# Patient Record
Sex: Female | Born: 1970 | Race: White | Hispanic: No | Marital: Married | Smoking: Former smoker
Health system: Southern US, Community
[De-identification: ages and names within clinical notes are randomized; demographics above are authoritative.]

## PROBLEM LIST (undated history)

## (undated) DIAGNOSIS — R002 Palpitations: Secondary | ICD-10-CM

## (undated) DIAGNOSIS — R197 Diarrhea, unspecified: Secondary | ICD-10-CM

## (undated) DIAGNOSIS — Z87442 Personal history of urinary calculi: Secondary | ICD-10-CM

## (undated) DIAGNOSIS — K219 Gastro-esophageal reflux disease without esophagitis: Secondary | ICD-10-CM

## (undated) DIAGNOSIS — I10 Essential (primary) hypertension: Secondary | ICD-10-CM

## (undated) DIAGNOSIS — D649 Anemia, unspecified: Secondary | ICD-10-CM

## (undated) DIAGNOSIS — K449 Diaphragmatic hernia without obstruction or gangrene: Secondary | ICD-10-CM

## (undated) DIAGNOSIS — F419 Anxiety disorder, unspecified: Secondary | ICD-10-CM

## (undated) HISTORY — PX: LEEP: SHX91

## (undated) HISTORY — PX: TUBAL LIGATION: SHX77

## (undated) HISTORY — PX: CHOLECYSTECTOMY: SHX55

## (undated) HISTORY — PX: APPENDECTOMY: SHX54

## (undated) HISTORY — DX: Diarrhea, unspecified: R19.7

## (undated) SURGERY — ESOPHAGOGASTRODUODENOSCOPY (EGD) WITH PROPOFOL
Anesthesia: Monitor Anesthesia Care

## (undated) SURGERY — COLONOSCOPY
Anesthesia: Moderate Sedation

---

## 2011-12-13 ENCOUNTER — Emergency Department (HOSPITAL_COMMUNITY)
Admission: EM | Admit: 2011-12-13 | Discharge: 2011-12-14 | Disposition: A | Discharge status: Home / Self Care | Payer: PRIVATE HEALTH INSURANCE | Attending: Emergency Medicine | Admitting: Emergency Medicine

## 2011-12-13 ENCOUNTER — Emergency Department (HOSPITAL_COMMUNITY): Payer: PRIVATE HEALTH INSURANCE

## 2011-12-13 ENCOUNTER — Encounter (HOSPITAL_COMMUNITY): Payer: Self-pay | Admitting: Emergency Medicine

## 2011-12-13 DIAGNOSIS — I10 Essential (primary) hypertension: Secondary | ICD-10-CM | POA: Insufficient documentation

## 2011-12-13 DIAGNOSIS — M79609 Pain in unspecified limb: Secondary | ICD-10-CM | POA: Insufficient documentation

## 2011-12-13 DIAGNOSIS — S93609A Unspecified sprain of unspecified foot, initial encounter: Secondary | ICD-10-CM | POA: Insufficient documentation

## 2011-12-13 DIAGNOSIS — S8391XA Sprain of unspecified site of right knee, initial encounter: Secondary | ICD-10-CM

## 2011-12-13 DIAGNOSIS — M25569 Pain in unspecified knee: Secondary | ICD-10-CM | POA: Insufficient documentation

## 2011-12-13 DIAGNOSIS — W010XXA Fall on same level from slipping, tripping and stumbling without subsequent striking against object, initial encounter: Secondary | ICD-10-CM | POA: Insufficient documentation

## 2011-12-13 DIAGNOSIS — S93601A Unspecified sprain of right foot, initial encounter: Secondary | ICD-10-CM

## 2011-12-13 DIAGNOSIS — K219 Gastro-esophageal reflux disease without esophagitis: Secondary | ICD-10-CM | POA: Insufficient documentation

## 2011-12-13 DIAGNOSIS — W19XXXA Unspecified fall, initial encounter: Secondary | ICD-10-CM

## 2011-12-13 DIAGNOSIS — IMO0002 Reserved for concepts with insufficient information to code with codable children: Secondary | ICD-10-CM | POA: Insufficient documentation

## 2011-12-13 HISTORY — DX: Essential (primary) hypertension: I10

## 2011-12-13 HISTORY — DX: Gastro-esophageal reflux disease without esophagitis: K21.9

## 2011-12-13 NOTE — ED Notes (Signed)
Patient was walking out of patient room and slipped on object in the floor.  Patient fell and twisted right foot and right knee.

## 2011-12-14 NOTE — Discharge Instructions (Signed)
Foot Sprain  The muscles and cord like structures which attach muscle to bone (tendons) that surround the feet are made up of units. A foot sprain can occur at the weakest spot in any of these units. This condition is most often caused by injury to or overuse of the foot, as from playing contact sports, or aggravating a previous injury, or from poor conditioning, or obesity.  SYMPTOMS  · Pain with movement of the foot.  · Tenderness and swelling at the injury site.  · Loss of strength is present in moderate or severe sprains.  THE THREE GRADES OR SEVERITY OF FOOT SPRAIN ARE:  · Mild (Grade I): Slightly pulled muscle without tearing of muscle or tendon fibers or loss of strength.  · Moderate (Grade II): Tearing of fibers in a muscle, tendon, or at the attachment to bone, with small decrease in strength.  · Severe (Grade III): Rupture of the muscle-tendon-bone attachment, with separation of fibers. Severe sprain requires surgical repair. Often repeating (chronic) sprains are caused by overuse. Sudden (acute) sprains are caused by direct injury or over-use.  DIAGNOSIS   Diagnosis of this condition is usually by your own observation. If problems continue, a caregiver may be required for further evaluation and treatment. X-rays may be required to make sure there are not breaks in the bones (fractures) present. Continued problems may require physical therapy for treatment.  PREVENTION  · Use strength and conditioning exercises appropriate for your sport.  · Warm up properly prior to working out.  · Use athletic shoes that are made for the sport you are participating in.  · Allow adequate time for healing. Early return to activities makes repeat injury more likely, and can lead to an unstable arthritic foot that can result in prolonged disability. Mild sprains generally heal in 3 to 10 days, with moderate and severe sprains taking 2 to 10 weeks. Your caregiver can help you determine the proper time required for  healing.  HOME CARE INSTRUCTIONS   · Apply ice to the injury for 15 to 20 minutes, 3 to 4 times per day. Put the ice in a plastic bag and place a towel between the bag of ice and your skin.  · An elastic wrap (like an Ace bandage) may be used to keep swelling down.  · Keep foot above the level of the heart, or at least raised on a footstool, when swelling and pain are present.  · Try to avoid use other than gentle range of motion while the foot is painful. Do not resume use until instructed by your caregiver. Then begin use gradually, not increasing use to the point of pain. If pain does develop, decrease use and continue the above measures, gradually increasing activities that do not cause discomfort, until you gradually achieve normal use.  · Use crutches if and as instructed, and for the length of time instructed.  · Keep injured foot and ankle wrapped between treatments.  · Massage foot and ankle for comfort and to keep swelling down. Massage from the toes up towards the knee.  · Only take over-the-counter or prescription medicines for pain, discomfort, or fever as directed by your caregiver.  SEEK IMMEDIATE MEDICAL CARE IF:   · Your pain and swelling increase, or pain is not controlled with medications.  · You have loss of feeling in your foot or your foot turns cold or blue.  · You develop new, unexplained symptoms, or an increase of the symptoms that brought you   to your caregiver.  MAKE SURE YOU:   · Understand these instructions.  · Will watch your condition.  · Will get help right away if you are not doing well or get worse.  Document Released: 03/02/2002 Document Revised: 08/30/2011 Document Reviewed: 04/29/2008  [employer]® Patient Information ©2012 [employer].

## 2011-12-14 NOTE — ED Provider Notes (Signed)
History     CSN: [id number]  Arrival date & time 12/13/11  2335   First MD Initiated Contact with Patient 12/14/11 0038      Chief Complaint  Patient presents with  . Foot Injury  . Fall    (Consider location/radiation/quality/duration/timing/severity/associated sxs/prior treatment) HPI Comments: Slipped on needle cap that was on the floor, injured the right foot and knee.  Patient is a 40 y.o. female presenting with foot injury and fall. The history is provided by the patient.  Foot Injury  The incident occurred less than 1 hour ago. The incident occurred at work. The injury mechanism was a fall. The pain is present in the right knee and right foot. The pain is moderate. The pain has been worsening since onset. Associated symptoms include inability to bear weight.  Fall    Past Medical History  Diagnosis Date  . Hypertension   . GERD (gastroesophageal reflux disease)     Past Surgical History  Procedure Date  . Cholecystectomy   . Tubal ligation     No family history on file.  History  Substance Use Topics  . Smoking status: Former Games developer  . Smokeless tobacco: Not on file  . Alcohol Use: No    OB History    Grav Para Term Preterm Abortions TAB SAB Ect Mult Living                  Review of Systems  All other systems reviewed and are negative.    Allergies  Aspirin and Sulfa antibiotics  Home Medications   Current Outpatient Rx  Name Route Sig Dispense Refill  . LISINOPRIL-HYDROCHLOROTHIAZIDE 20-12.5 MG PO TABS Oral Take 2 tablets by mouth daily.    Marland Kitchen OMEPRAZOLE 20 MG PO CPDR Oral Take 20 mg by mouth daily.      BP 143/79  Pulse 93  Temp(Src) 98.3 F (36.8 C) (Oral)  Resp 18  Ht 5\' 3"  (1.6 m)  Wt 250 lb (113.399 kg)  BMI 44.29 kg/m2  SpO2 97%  LMP 11/15/2011  Physical Exam  Nursing note and vitals reviewed. Constitutional: She is oriented to person, place, and time. She appears well-developed and well-nourished. No distress.  HENT:    Head: Normocephalic and atraumatic.  Neck: Normal range of motion. Neck supple.  Musculoskeletal:       The right foot has a swollen, slightly ecchymotic area to the top of the foot near the distal 4th and 5th metacarpals.    The right knee appears grossly normal.  There is no effusion.  It is stable ap/laterally.  Negative a and p drawer test.  Neurological: She is alert and oriented to person, place, and time.  Skin: Skin is warm and dry.    ED Course  Procedures (including critical care time)  Labs Reviewed - No data to display Dg Knee Complete 4 Views Right  12/14/2011  *RADIOLOGY REPORT*  Clinical Data: Status post fall; right knee pain.  RIGHT KNEE - COMPLETE 4+ VIEW  Comparison: None.  Findings: There is no evidence of fracture or dislocation.  The joint spaces are preserved.  No significant degenerative change is seen; the patellofemoral joint is grossly unremarkable in appearance.  No significant joint effusion is seen.  The visualized soft tissues are normal in appearance.  IMPRESSION: No evidence of fracture or dislocation.  Original Report Authenticated By: Tonia Ghent, M.D.   Dg Foot Complete Right  12/14/2011  *RADIOLOGY REPORT*  Clinical Data: Status post fall; right foot  pain, worsened with toe movement.  RIGHT FOOT COMPLETE - 3+ VIEW  Comparison: None.  Findings: There is no evidence of fracture or dislocation.  The joint spaces are preserved.  There is no evidence of talar subluxation; the subtalar joint is unremarkable in appearance.  A plantar calcaneal spur is seen.  No significant soft tissue abnormalities are seen.  IMPRESSION: No evidence of fracture or dislocation.  Original Report Authenticated By: Tonia Ghent, M.D.     No diagnosis found.    MDM          Geoffery Lyons, MD 12/14/11 [phone]

## 2012-11-03 ENCOUNTER — Emergency Department (HOSPITAL_COMMUNITY)
Admission: EM | Admit: 2012-11-03 | Discharge: 2012-11-04 | Disposition: A | Discharge status: Home / Self Care | Payer: BC Managed Care – PPO | Attending: Emergency Medicine | Admitting: Emergency Medicine

## 2012-11-03 ENCOUNTER — Encounter (HOSPITAL_COMMUNITY): Payer: Self-pay | Admitting: *Deleted

## 2012-11-03 DIAGNOSIS — I10 Essential (primary) hypertension: Secondary | ICD-10-CM | POA: Insufficient documentation

## 2012-11-03 DIAGNOSIS — Z79899 Other long term (current) drug therapy: Secondary | ICD-10-CM | POA: Insufficient documentation

## 2012-11-03 DIAGNOSIS — R197 Diarrhea, unspecified: Secondary | ICD-10-CM | POA: Insufficient documentation

## 2012-11-03 DIAGNOSIS — R109 Unspecified abdominal pain: Secondary | ICD-10-CM | POA: Insufficient documentation

## 2012-11-03 DIAGNOSIS — Z3202 Encounter for pregnancy test, result negative: Secondary | ICD-10-CM | POA: Insufficient documentation

## 2012-11-03 DIAGNOSIS — K529 Noninfective gastroenteritis and colitis, unspecified: Secondary | ICD-10-CM

## 2012-11-03 DIAGNOSIS — Z87891 Personal history of nicotine dependence: Secondary | ICD-10-CM | POA: Insufficient documentation

## 2012-11-03 DIAGNOSIS — K219 Gastro-esophageal reflux disease without esophagitis: Secondary | ICD-10-CM | POA: Insufficient documentation

## 2012-11-03 DIAGNOSIS — K5289 Other specified noninfective gastroenteritis and colitis: Secondary | ICD-10-CM | POA: Insufficient documentation

## 2012-11-03 LAB — CBC WITH DIFFERENTIAL/PLATELET
Eosinophils Absolute: 0.1 10*3/uL (ref 0.0–0.7)
Eosinophils Relative: 1 % (ref 0–5)
HCT: 42.3 % (ref 36.0–46.0)
Lymphs Abs: 2.7 10*3/uL (ref 0.7–4.0)
MCH: 30.2 pg (ref 26.0–34.0)
MCV: 88.7 fL (ref 78.0–100.0)
Monocytes Absolute: 1.1 10*3/uL — ABNORMAL HIGH (ref 0.1–1.0)
Platelets: 271 10*3/uL (ref 150–400)
RBC: 4.77 MIL/uL (ref 3.87–5.11)

## 2012-11-03 LAB — BASIC METABOLIC PANEL
BUN: 9 mg/dL (ref 6–23)
CO2: 24 mEq/L (ref 19–32)
Calcium: 8.8 mg/dL (ref 8.4–10.5)
Creatinine, Ser: 0.65 mg/dL (ref 0.50–1.10)
GFR calc non Af Amer: >90 mL/min (ref >90)
Glucose, Bld: 148 mg/dL — ABNORMAL HIGH (ref 70–99)
Sodium: 136 mEq/L (ref 135–145)

## 2012-11-03 MED ORDER — SODIUM CHLORIDE 0.9 % IV BOLUS (SEPSIS)
1000.0000 mL | Freq: Once | INTRAVENOUS | Status: AC
  Administered 2012-11-03: 1000 mL via INTRAVENOUS

## 2012-11-03 MED ORDER — ONDANSETRON HCL 4 MG/2ML IJ SOLN
INTRAMUSCULAR | Status: AC
  Administered 2012-11-03: 4 mg
  Filled 2012-11-03: qty 2

## 2012-11-03 NOTE — ED Provider Notes (Signed)
Patient with nausea, vomiting, diarrhea illness. Abdomen with no focal area of tenderness. Hyperactive bowel sounds. VSS. Receiving IVF. Ok for discharge home.  I saw and evaluated the patient, reviewed the resident's note and I agree with the findings and plan.    Nicoletta Dress. Colon Branch, MD 11/03/12 2333

## 2012-11-03 NOTE — ED Notes (Signed)
NVD since Saturday, dizzy.

## 2012-11-04 LAB — URINE CULTURE: Colony Count: 70000

## 2012-11-04 LAB — URINALYSIS, ROUTINE W REFLEX MICROSCOPIC
Bilirubin Urine: NEGATIVE
Glucose, UA: NEGATIVE mg/dL
Hgb urine dipstick: NEGATIVE
Ketones, ur: NEGATIVE mg/dL
Urobilinogen, UA: 0.2 mg/dL (ref 0.0–1.0)

## 2012-11-04 LAB — URINE MICROSCOPIC-ADD ON

## 2012-11-04 MED ORDER — POTASSIUM CHLORIDE CRYS ER 20 MEQ PO TBCR: 40.0000 meq | EXTENDED_RELEASE_TABLET | Freq: Once | ORAL | Status: DC

## 2012-11-04 MED ORDER — PROMETHAZINE HCL 12.5 MG PO TABS
ORAL_TABLET | ORAL | Status: AC
  Administered 2012-11-04: 12.5 mg
  Filled 2012-11-04: qty 1

## 2012-11-04 MED ORDER — POTASSIUM CHLORIDE 20 MEQ/15ML (10%) PO LIQD
ORAL | Status: AC
  Administered 2012-11-04: 40 meq
  Filled 2012-11-04: qty 30

## 2012-11-04 MED ORDER — PROMETHAZINE HCL 25 MG PO TABS: 25.0000 mg | ORAL_TABLET | Freq: Four times a day (QID) | ORAL | Status: DC | PRN

## 2012-11-04 NOTE — ED Provider Notes (Signed)
History     CSN: [id number]  Arrival date & time 11/03/12  2213   First MD Initiated Contact with Patient 11/03/12 2307      Chief Complaint  Patient presents with  . Emesis    (Consider location/radiation/quality/duration/timing/severity/associated sxs/prior treatment) Patient is a 42 y.o. female presenting with vomiting. The history is provided by the patient.  Emesis Severity:  Moderate Duration:  3 days Timing:  Intermittent Number of daily episodes:  5 Quality:  Stomach contents Able to tolerate:  Solids and liquids Progression:  Worsening Chronicity:  New Recent urination:  Decreased Relieved by:  Nothing Worsened by:  Nothing tried Associated symptoms: abdominal pain and diarrhea   Associated symptoms: no chills     Past Medical History  Diagnosis Date  . Hypertension   . GERD (gastroesophageal reflux disease)     Past Surgical History  Procedure Laterality Date  . Cholecystectomy    . Tubal ligation      History reviewed. No pertinent family history.  History  Substance Use Topics  . Smoking status: Former Games developer  . Smokeless tobacco: Not on file  . Alcohol Use: No    OB History   Grav Para Term Preterm Abortions TAB SAB Ect Mult Living                  Review of Systems  Constitutional: Negative for fever and chills.  Respiratory: Negative for cough and shortness of breath.   Gastrointestinal: Positive for nausea, vomiting, abdominal pain and diarrhea.  All other systems reviewed and are negative.    Allergies  Aspirin and Sulfa antibiotics  Home Medications   Current Outpatient Rx  Name  Route  Sig  Dispense  Refill  . lisinopril-hydrochlorothiazide (PRINZIDE,ZESTORETIC) 20-12.5 MG per tablet   Oral   Take 2 tablets by mouth daily.         Marland Kitchen omeprazole (PRILOSEC) 20 MG capsule   Oral   Take 20 mg by mouth daily.           BP 132/71  Pulse 94  Temp(Src) 98.2 F (36.8 C) (Oral)  Resp 18  Ht 5\' 2"  (1.575 m)  Wt 265  lb (120.203 kg)  BMI 48.46 kg/m2  SpO2 98%  LMP 10/10/2012  Physical Exam  Nursing note and vitals reviewed. Constitutional: She is oriented to person, place, and time. She appears well-developed and well-nourished. No distress.  HENT:  Head: Normocephalic and atraumatic.  Eyes: EOM are normal. Pupils are equal, round, and reactive to light.  Neck: Normal range of motion. Neck supple.  Cardiovascular: Normal rate and regular rhythm.  Exam reveals no friction rub.   No murmur heard. Pulmonary/Chest: Effort normal and breath sounds normal. No respiratory distress. She has no wheezes. She has no rales.  Abdominal: Soft. She exhibits no distension. There is tenderness (mild, diffuse). There is no rebound.  Musculoskeletal: Normal range of motion. She exhibits no edema.  Neurological: She is alert and oriented to person, place, and time.  Skin: She is not diaphoretic.    ED Course  Procedures (including critical care time)  Labs Reviewed  CBC WITH DIFFERENTIAL - Abnormal; Notable for the following:    WBC 10.8 (*)    Monocytes Absolute 1.1 (*)    All other components within normal limits  BASIC METABOLIC PANEL - Abnormal; Notable for the following:    Potassium 3.0 (*)    Glucose, Bld 148 (*)    All other components within normal limits  URINALYSIS, ROUTINE W REFLEX MICROSCOPIC - Abnormal; Notable for the following:    APPearance CLOUDY (*)    Specific Gravity, Urine >1.030 (*)    Protein, ur TRACE (*)    All other components within normal limits  URINE MICROSCOPIC-ADD ON - Abnormal; Notable for the following:    Squamous Epithelial / LPF FEW (*)    Bacteria, UA MANY (*)    All other components within normal limits  URINE CULTURE  POCT PREGNANCY, URINE   No results found.   1. Gastroenteritis       MDM   42 year old female who presents with vomiting and diarrhea. She's had these symptoms for the past 3 days. She does use getting better and was able to tolerate by  mouth today, however she began vomiting again tonight. She is not running any fevers. She has had no sick contacts. She denies any shortness of breath, chest pain, dysuria, vaginal bleeding, vaginal discharge. Para vitals are stable. She is mild diffuse abdominal tenderness. She states this is the same crampy pain she's had since this started. She is a Engineer, civil (consulting) here. I feel she has gastroenteritis. I gave HER-2 liters normal saline. Her labs show mild dehydration with elevated specific gravity in her urine and mild hypokalemia. Replace her potassium by mouth. After 2 L of saline, she states she is feeling better. She is stable for discharge.  Elwin Mocha, MD 11/04/12 [phone]

## 2012-11-04 NOTE — ED Provider Notes (Signed)
I saw and evaluated the patient, reviewed the resident's note and I agree with the findings and plan.  Caelen Higinbotham S. Oceana Walthall, MD 11/04/12 0112 

## 2013-08-25 ENCOUNTER — Encounter (HOSPITAL_COMMUNITY)
Admission: EM | Disposition: A | Discharge status: Home / Self Care | Payer: Self-pay | Source: Home / Self Care | Attending: Emergency Medicine

## 2013-08-25 ENCOUNTER — Observation Stay (HOSPITAL_COMMUNITY)
Admission: EM | Admit: 2013-08-25 | Discharge: 2013-08-26 | Disposition: A | Discharge status: Home / Self Care | Payer: BC Managed Care – PPO | Attending: General Surgery | Admitting: General Surgery

## 2013-08-25 ENCOUNTER — Encounter (HOSPITAL_COMMUNITY): Payer: BC Managed Care – PPO | Admitting: Anesthesiology

## 2013-08-25 ENCOUNTER — Emergency Department (HOSPITAL_COMMUNITY): Payer: BC Managed Care – PPO | Admitting: Anesthesiology

## 2013-08-25 ENCOUNTER — Emergency Department (HOSPITAL_COMMUNITY): Payer: BC Managed Care – PPO

## 2013-08-25 ENCOUNTER — Encounter (HOSPITAL_COMMUNITY): Payer: Self-pay | Admitting: Emergency Medicine

## 2013-08-25 DIAGNOSIS — K3580 Unspecified acute appendicitis: Secondary | ICD-10-CM | POA: Diagnosis present

## 2013-08-25 DIAGNOSIS — K37 Unspecified appendicitis: Secondary | ICD-10-CM

## 2013-08-25 DIAGNOSIS — K3533 Acute appendicitis with perforation and localized peritonitis, with abscess: Principal | ICD-10-CM | POA: Insufficient documentation

## 2013-08-25 HISTORY — DX: Diaphragmatic hernia without obstruction or gangrene: K44.9

## 2013-08-25 HISTORY — PX: LAPAROSCOPIC APPENDECTOMY: SHX408

## 2013-08-25 LAB — COMPREHENSIVE METABOLIC PANEL
CO2: 23 mEq/L (ref 19–32)
Calcium: 9.7 mg/dL (ref 8.4–10.5)
Creatinine, Ser: 0.6 mg/dL (ref 0.50–1.10)
GFR calc Af Amer: >90 mL/min (ref >90)
GFR calc non Af Amer: >90 mL/min (ref >90)
Glucose, Bld: 119 mg/dL — ABNORMAL HIGH (ref 70–99)
Sodium: 134 mEq/L — ABNORMAL LOW (ref 135–145)
Total Protein: 8.4 g/dL — ABNORMAL HIGH (ref 6.0–8.3)

## 2013-08-25 LAB — CBC WITH DIFFERENTIAL/PLATELET
Basophils Absolute: 0 10*3/uL (ref 0.0–0.1)
Eosinophils Absolute: 0 10*3/uL (ref 0.0–0.7)
Eosinophils Relative: 0 % (ref 0–5)
HCT: 43.7 % (ref 36.0–46.0)
Lymphocytes Relative: 15 % (ref 12–46)
Lymphs Abs: 2.5 10*3/uL (ref 0.7–4.0)
MCH: 29.3 pg (ref 26.0–34.0)
MCV: 87.8 fL (ref 78.0–100.0)
Monocytes Absolute: 1.2 10*3/uL — ABNORMAL HIGH (ref 0.1–1.0)
Platelets: 284 10*3/uL (ref 150–400)
RDW: 14.9 % (ref 11.5–15.5)
WBC: 17.4 10*3/uL — ABNORMAL HIGH (ref 4.0–10.5)

## 2013-08-25 LAB — PREGNANCY, URINE: Preg Test, Ur: NEGATIVE

## 2013-08-25 LAB — URINALYSIS, ROUTINE W REFLEX MICROSCOPIC
Leukocytes, UA: NEGATIVE
Nitrite: NEGATIVE
Protein, ur: NEGATIVE mg/dL
Specific Gravity, Urine: 1.025 (ref 1.005–1.030)
Urobilinogen, UA: 0.2 mg/dL (ref 0.0–1.0)

## 2013-08-25 SURGERY — APPENDECTOMY, LAPAROSCOPIC
Anesthesia: General | Site: Abdomen

## 2013-08-25 MED ORDER — POVIDONE-IODINE 10 % EX OINT
TOPICAL_OINTMENT | CUTANEOUS | Status: AC
  Filled 2013-08-25: qty 1

## 2013-08-25 MED ORDER — LACTATED RINGERS IV SOLN
INTRAVENOUS | Status: DC
  Administered 2013-08-26: 03:00:00 via INTRAVENOUS

## 2013-08-25 MED ORDER — SODIUM CHLORIDE 0.9 % IV SOLN
3.0000 g | Freq: Four times a day (QID) | INTRAVENOUS | Status: DC
  Administered 2013-08-25 – 2013-08-26 (×2): 3 g via INTRAVENOUS
  Filled 2013-08-25 (×3): qty 3

## 2013-08-25 MED ORDER — IOHEXOL 300 MG/ML  SOLN
50.0000 mL | Freq: Once | INTRAMUSCULAR | Status: AC | PRN
  Administered 2013-08-25: 50 mL via ORAL

## 2013-08-25 MED ORDER — ONDANSETRON HCL 4 MG PO TABS: 4.0000 mg | ORAL_TABLET | Freq: Four times a day (QID) | ORAL | Status: DC | PRN

## 2013-08-25 MED ORDER — MIDAZOLAM HCL 2 MG/2ML IJ SOLN
INTRAMUSCULAR | Status: DC | PRN
  Administered 2013-08-25 (×2): 1 mg via INTRAVENOUS

## 2013-08-25 MED ORDER — LORATADINE 10 MG PO TABS
10.0000 mg | ORAL_TABLET | Freq: Every day | ORAL | Status: DC
  Filled 2013-08-25: qty 1

## 2013-08-25 MED ORDER — FENTANYL CITRATE 0.05 MG/ML IJ SOLN
INTRAMUSCULAR | Status: DC | PRN
  Administered 2013-08-25 (×3): 50 ug via INTRAVENOUS
  Administered 2013-08-25: 25 ug via INTRAVENOUS
  Administered 2013-08-25: 50 ug via INTRAVENOUS
  Administered 2013-08-25: 25 ug via INTRAVENOUS

## 2013-08-25 MED ORDER — OXYCODONE-ACETAMINOPHEN 5-325 MG PO TABS
1.0000 | ORAL_TABLET | ORAL | Status: DC | PRN
Start: 2013-08-25 — End: 2013-08-26
  Administered 2013-08-26 (×2): 2 via ORAL
  Filled 2013-08-25 (×2): qty 2

## 2013-08-25 MED ORDER — LIDOCAINE HCL (CARDIAC) 10 MG/ML IV SOLN
INTRAVENOUS | Status: DC | PRN
  Administered 2013-08-25: 20 mg via INTRAVENOUS

## 2013-08-25 MED ORDER — ONDANSETRON HCL 4 MG/2ML IJ SOLN
INTRAMUSCULAR | Status: DC | PRN
  Administered 2013-08-25: 4 mg via INTRAVENOUS

## 2013-08-25 MED ORDER — LACTATED RINGERS IV SOLN
INTRAVENOUS | Status: DC | PRN
  Administered 2013-08-25: 20:00:00 via INTRAVENOUS

## 2013-08-25 MED ORDER — HYDROMORPHONE HCL PF 1 MG/ML IJ SOLN: 1.0000 mg | INTRAMUSCULAR | Status: DC | PRN

## 2013-08-25 MED ORDER — MORPHINE SULFATE 4 MG/ML IJ SOLN
4.0000 mg | Freq: Once | INTRAMUSCULAR | Status: AC
  Administered 2013-08-25: 4 mg via INTRAVENOUS
  Filled 2013-08-25: qty 1

## 2013-08-25 MED ORDER — LISINOPRIL-HYDROCHLOROTHIAZIDE 20-12.5 MG PO TABS: 2.0000 | ORAL_TABLET | Freq: Every day | ORAL | Status: DC

## 2013-08-25 MED ORDER — BUPIVACAINE HCL (PF) 0.5 % IJ SOLN
INTRAMUSCULAR | Status: DC | PRN
  Administered 2013-08-25: 10 mL

## 2013-08-25 MED ORDER — HYDROMORPHONE HCL PF 1 MG/ML IJ SOLN
1.0000 mg | Freq: Once | INTRAMUSCULAR | Status: AC
  Administered 2013-08-25: 1 mg via INTRAVENOUS
  Filled 2013-08-25: qty 1

## 2013-08-25 MED ORDER — PROPOFOL 10 MG/ML IV EMUL
INTRAVENOUS | Status: AC
  Filled 2013-08-25: qty 20

## 2013-08-25 MED ORDER — GLYCOPYRROLATE 0.2 MG/ML IJ SOLN
INTRAMUSCULAR | Status: AC
  Filled 2013-08-25: qty 2

## 2013-08-25 MED ORDER — SODIUM CHLORIDE 0.9 % IR SOLN
Status: DC | PRN
  Administered 2013-08-25: 500 mL

## 2013-08-25 MED ORDER — ROCURONIUM BROMIDE 50 MG/5ML IV SOLN
INTRAVENOUS | Status: AC
  Filled 2013-08-25: qty 1

## 2013-08-25 MED ORDER — KETOROLAC TROMETHAMINE 30 MG/ML IJ SOLN
30.0000 mg | Freq: Once | INTRAMUSCULAR | Status: DC
  Administered 2013-08-25: 30 mg via INTRAVENOUS

## 2013-08-25 MED ORDER — MIDAZOLAM HCL 2 MG/2ML IJ SOLN
INTRAMUSCULAR | Status: AC
  Filled 2013-08-25: qty 2

## 2013-08-25 MED ORDER — SODIUM CHLORIDE 0.9 % IV SOLN
INTRAVENOUS | Status: AC
  Filled 2013-08-25 (×2): qty 3

## 2013-08-25 MED ORDER — HYDROCHLOROTHIAZIDE 25 MG PO TABS
25.0000 mg | ORAL_TABLET | Freq: Every day | ORAL | Status: DC
  Filled 2013-08-25: qty 1

## 2013-08-25 MED ORDER — FENTANYL CITRATE 0.05 MG/ML IJ SOLN
INTRAMUSCULAR | Status: AC
  Filled 2013-08-25: qty 5

## 2013-08-25 MED ORDER — ONDANSETRON HCL 4 MG/2ML IJ SOLN: 4.0000 mg | Freq: Four times a day (QID) | INTRAMUSCULAR | Status: DC | PRN

## 2013-08-25 MED ORDER — LIDOCAINE HCL (PF) 1 % IJ SOLN
INTRAMUSCULAR | Status: AC
  Filled 2013-08-25: qty 5

## 2013-08-25 MED ORDER — NEOSTIGMINE METHYLSULFATE 1 MG/ML IJ SOLN
INTRAMUSCULAR | Status: DC | PRN
  Administered 2013-08-25: 2 mg via INTRAVENOUS

## 2013-08-25 MED ORDER — SUCCINYLCHOLINE CHLORIDE 20 MG/ML IJ SOLN
INTRAMUSCULAR | Status: AC
  Filled 2013-08-25: qty 1

## 2013-08-25 MED ORDER — LISINOPRIL 10 MG PO TABS
40.0000 mg | ORAL_TABLET | Freq: Every day | ORAL | Status: DC
  Filled 2013-08-25: qty 4

## 2013-08-25 MED ORDER — SUCCINYLCHOLINE CHLORIDE 20 MG/ML IJ SOLN
INTRAMUSCULAR | Status: DC | PRN
  Administered 2013-08-25: 140 mg via INTRAVENOUS

## 2013-08-25 MED ORDER — ONDANSETRON HCL 4 MG/2ML IJ SOLN
INTRAMUSCULAR | Status: AC
  Filled 2013-08-25: qty 2

## 2013-08-25 MED ORDER — KETOROLAC TROMETHAMINE 30 MG/ML IJ SOLN
INTRAMUSCULAR | Status: AC
  Filled 2013-08-25: qty 1

## 2013-08-25 MED ORDER — PROPOFOL 10 MG/ML IV BOLUS
INTRAVENOUS | Status: DC | PRN
  Administered 2013-08-25: 180 mg via INTRAVENOUS
  Administered 2013-08-25: 20 mg via INTRAVENOUS

## 2013-08-25 MED ORDER — ROCURONIUM BROMIDE 100 MG/10ML IV SOLN
INTRAVENOUS | Status: DC | PRN
  Administered 2013-08-25: 35 mg via INTRAVENOUS
  Administered 2013-08-25: 5 mg via INTRAVENOUS

## 2013-08-25 MED ORDER — ONDANSETRON HCL 4 MG/2ML IJ SOLN
4.0000 mg | Freq: Once | INTRAMUSCULAR | Status: AC
  Administered 2013-08-25: 4 mg via INTRAVENOUS
  Filled 2013-08-25: qty 2

## 2013-08-25 MED ORDER — IOHEXOL 300 MG/ML  SOLN
100.0000 mL | Freq: Once | INTRAMUSCULAR | Status: AC | PRN
  Administered 2013-08-25: 100 mL via INTRAVENOUS

## 2013-08-25 MED ORDER — SODIUM CHLORIDE 0.9 % IV SOLN
3.0000 g | Freq: Once | INTRAVENOUS | Status: AC
  Administered 2013-08-25: 3 g via INTRAVENOUS
  Filled 2013-08-25: qty 3

## 2013-08-25 MED ORDER — BUPIVACAINE HCL (PF) 0.5 % IJ SOLN
INTRAMUSCULAR | Status: AC
  Filled 2013-08-25: qty 30

## 2013-08-25 MED ORDER — GLYCOPYRROLATE 0.2 MG/ML IJ SOLN
INTRAMUSCULAR | Status: DC | PRN
  Administered 2013-08-25: 0.4 mg via INTRAVENOUS

## 2013-08-25 MED ORDER — ENOXAPARIN SODIUM 40 MG/0.4ML ~~LOC~~ SOLN
40.0000 mg | SUBCUTANEOUS | Status: DC
  Administered 2013-08-25: 40 mg via SUBCUTANEOUS
  Filled 2013-08-25: qty 0.4

## 2013-08-25 SURGICAL SUPPLY — 44 items
BAG HAMPER (MISCELLANEOUS) ×2 IMPLANT
CLOTH BEACON ORANGE TIMEOUT ST (SAFETY) ×2 IMPLANT
COVER LIGHT HANDLE STERIS (MISCELLANEOUS) ×4 IMPLANT
CUTTER FLEX LINEAR 45M (STAPLE) IMPLANT
CUTTER LINEAR ENDO 35 ETS (STAPLE) ×2 IMPLANT
CUTTER LINEAR ENDO 35 ETS TH (STAPLE) IMPLANT
DECANTER SPIKE VIAL GLASS SM (MISCELLANEOUS) ×2 IMPLANT
DISSECTOR BLUNT TIP ENDO 5MM (MISCELLANEOUS) IMPLANT
DURAPREP 26ML APPLICATOR (WOUND CARE) ×2 IMPLANT
ELECT REM PT RETURN 9FT ADLT (ELECTROSURGICAL) ×2
ELECTRODE REM PT RTRN 9FT ADLT (ELECTROSURGICAL) ×1 IMPLANT
FILTER SMOKE EVAC LAPAROSHD (FILTER) ×2 IMPLANT
FORMALIN 10 PREFIL 120ML (MISCELLANEOUS) ×2 IMPLANT
GLOVE BIO SURGEON STRL SZ7.5 (GLOVE) ×2 IMPLANT
GLOVE BIOGEL PI IND STRL 8 (GLOVE) ×1 IMPLANT
GLOVE BIOGEL PI INDICATOR 8 (GLOVE) ×1
GLOVE ECLIPSE 7.0 STRL STRAW (GLOVE) ×2 IMPLANT
GOWN STRL REIN XL XLG (GOWN DISPOSABLE) ×4 IMPLANT
INST SET LAPROSCOPIC AP (KITS) ×2 IMPLANT
IV NS IRRIG 3000ML ARTHROMATIC (IV SOLUTION) IMPLANT
KIT ROOM TURNOVER APOR (KITS) ×2 IMPLANT
MANIFOLD NEPTUNE II (INSTRUMENTS) ×2 IMPLANT
NEEDLE INSUFFLATION 14GA 120MM (NEEDLE) ×2 IMPLANT
NS IRRIG 1000ML POUR BTL (IV SOLUTION) ×2 IMPLANT
PACK LAP CHOLE LZT030E (CUSTOM PROCEDURE TRAY) ×2 IMPLANT
PAD ARMBOARD 7.5X6 YLW CONV (MISCELLANEOUS) ×2 IMPLANT
POUCH SPECIMEN RETRIEVAL 10MM (ENDOMECHANICALS) ×2 IMPLANT
RELOAD /EVU35 (ENDOMECHANICALS) IMPLANT
RELOAD 45 VASCULAR/THIN (ENDOMECHANICALS) IMPLANT
RELOAD CUTTER ETS 35MM STAND (ENDOMECHANICALS) IMPLANT
SCALPEL HARMONIC ACE (MISCELLANEOUS) ×2 IMPLANT
SET BASIN LINEN APH (SET/KITS/TRAYS/PACK) ×2 IMPLANT
SET TUBE IRRIG SUCTION NO TIP (IRRIGATION / IRRIGATOR) IMPLANT
SPONGE GAUZE 2X2 8PLY STRL LF (GAUZE/BANDAGES/DRESSINGS) ×6 IMPLANT
STAPLER VISISTAT (STAPLE) ×2 IMPLANT
SUT VICRYL 0 UR6 27IN ABS (SUTURE) ×2 IMPLANT
TAPE CLOTH SURG 4X10 WHT LF (GAUZE/BANDAGES/DRESSINGS) ×2 IMPLANT
TRAY FOLEY CATH 16FR SILVER (SET/KITS/TRAYS/PACK) ×2 IMPLANT
TROCAR ENDO BLADELESS 11MM (ENDOMECHANICALS) ×2 IMPLANT
TROCAR ENDO BLADELESS 12MM (ENDOMECHANICALS) ×2 IMPLANT
TROCAR XCEL NON-BLD 5MMX100MML (ENDOMECHANICALS) ×2 IMPLANT
TUBING INSUFFLATION (TUBING) ×2 IMPLANT
WARMER LAPAROSCOPE (MISCELLANEOUS) ×2 IMPLANT
YANKAUER SUCT 12FT TUBE ARGYLE (SUCTIONS) ×2 IMPLANT

## 2013-08-25 NOTE — Anesthesia Postprocedure Evaluation (Signed)
Anesthesia Post Note  Patient: Tammy Novak  Procedure(s) Performed: Procedure(s) (LRB): APPENDECTOMY LAPAROSCOPIC (N/A)  Anesthesia type: General  Patient location: PACU  Post pain: Pain level controlled  Post assessment: Post-op Vital signs reviewed, Patient's Cardiovascular Status Stable, Respiratory Function Stable, Patent Airway, No signs of Nausea or vomiting and Pain level controlled  Last Vitals:  Filed Vitals:   08/25/13 2132  BP: 108/74  Pulse: 80  Temp: 37.2 C  Resp: 16    Post vital signs: Reviewed and stable  Level of consciousness: awake and alert   Complications: No apparent anesthesia complications

## 2013-08-25 NOTE — ED Provider Notes (Signed)
CSN: [id number]     Arrival date & time 08/25/13  1356 History   This chart was scribed for [name], MD, by Yevette Edwards, ED Scribe. This patient was seen in room APA04/[facility]04 and the patient's care was started at 2:18 PM.  First MD Initiated Contact with Patient 08/25/13 1401     Chief Complaint  Patient presents with  . Abdominal Pain    Patient is a 42 y.o. female presenting with abdominal pain. The history is provided by the patient.  Abdominal Pain Pain location:  RUQ and RLQ Pain quality: sharp   Pain radiates to:  Epigastric region and periumbilical region Pain severity:  Severe Onset quality:  Sudden Duration:  1 day Progression:  Unchanged Chronicity:  New Context: sick contacts   Relieved by:  Nothing Worsened by:  Nothing tried Ineffective treatments:  None tried Associated symptoms: chills, diarrhea and nausea   Associated symptoms: no dysuria and no fever   Risk factors: obesity    HPI Comments: Tammy Novak is a 42 y.o. Female, with a h/o hiatal hernia and GERD, who presents to the Emergency Department complaining of RLQ abdominal pain which radiates diffusely and which began yesterday evening at work.  She rates the pain as 8/10, and she reports the pain has interrupted her sleep. As associated symptoms, she has experienced nausea, diarrhea, and chills. The pt denies emesis, fever, dysuria, and back pain. She has a h/o abdominal surgery including cholecystectomy. She is a former smoker.    The pt uses Donnita Falls, A FNP, as her PCP.  Past Medical History  Diagnosis Date  . Hypertension   . GERD (gastroesophageal reflux disease)   . Hiatal hernia    Past Surgical History  Procedure Laterality Date  . Cholecystectomy    . Tubal ligation     No family history on file. History  Substance Use Topics  . Smoking status: Former Games developer  . Smokeless tobacco: Not on file  . Alcohol Use: No   No OB history provided.  Review of Systems   Constitutional: Positive for chills. Negative for fever.  Gastrointestinal: Positive for nausea, abdominal pain and diarrhea.  Genitourinary: Negative for dysuria.  Musculoskeletal: Negative for back pain.  All other systems reviewed and are negative.   Allergies  Aspirin and Sulfa antibiotics  Home Medications   Current Outpatient Rx  Name  Route  Sig  Dispense  Refill  . lisinopril-hydrochlorothiazide (PRINZIDE,ZESTORETIC) 20-12.5 MG per tablet   Oral   Take 2 tablets by mouth daily.         Marland Kitchen omeprazole (PRILOSEC) 20 MG capsule   Oral   Take 20 mg by mouth daily.         . promethazine (PHENERGAN) 25 MG tablet   Oral   Take 1 tablet (25 mg total) by mouth every 6 (six) hours as needed for nausea.   30 tablet   0    Triage Vitals: BP 145/91  Pulse 114  Temp(Src) 98.3 F (36.8 C) (Oral)  SpO2 98%  LMP 07/29/2013  Physical Exam  Nursing note and vitals reviewed. Constitutional: She is oriented to person, place, and time. She appears well-developed and well-nourished. No distress.  HENT:  Head: Normocephalic and atraumatic.  Eyes: EOM are normal.  Neck: Normal range of motion. Neck supple. No tracheal deviation present.  Cardiovascular: Normal rate and normal heart sounds.   No murmur heard. Pulmonary/Chest: Effort normal and breath sounds normal. No respiratory distress. She has no  wheezes.  Abdominal: Soft. There is tenderness. There is no rebound and no guarding.  Moderate RUQ and RLQ pain.   Musculoskeletal: Normal range of motion.  Neurological: She is alert and oriented to person, place, and time.  Skin: Skin is warm and dry.  Psychiatric: She has a normal mood and affect. Her behavior is normal.    ED Course  Procedures (including critical care time)  DIAGNOSTIC STUDIES: Oxygen Saturation is 98% on room air, normal by my interpretation.    COORDINATION OF CARE:  2:22 PM- Discussed treatment plan with patient, and the patient agreed to the  plan.   Labs Review Labs Reviewed  CBC WITH DIFFERENTIAL - Abnormal; Notable for the following:    WBC 17.4 (*)    Neutrophils Relative % 78 (*)    Neutro Abs 13.7 (*)    Monocytes Absolute 1.2 (*)    All other components within normal limits  COMPREHENSIVE METABOLIC PANEL - Abnormal; Notable for the following:    Sodium 134 (*)    Potassium 3.4 (*)    Glucose, Bld 119 (*)    Total Protein 8.4 (*)    All other components within normal limits  LIPASE, BLOOD  URINALYSIS, ROUTINE W REFLEX MICROSCOPIC  PREGNANCY, URINE   Imaging Review No results found.  EKG Interpretation   None       MDM  No diagnosis found. appendicitis  The chart was scribed for me under my direct supervision.  I personally performed the history, physical, and medical decision making and all procedures in the evaluation of this patient.[name], MD 08/25/13 [phone]

## 2013-08-25 NOTE — Anesthesia Procedure Notes (Signed)
Procedure Name: Intubation Date/Time: 08/25/2013 7:55 PM Performed by: Franco Nones Pre-anesthesia Checklist: Patient identified, Emergency Drugs available, Suction available, Patient being monitored and Timeout performed Patient Re-evaluated:Patient Re-evaluated prior to inductionOxygen Delivery Method: Circle system utilized Preoxygenation: Pre-oxygenation with 100% oxygen Intubation Type: IV induction, Rapid sequence and Cricoid Pressure applied Laryngoscope Size: Mac and 3 Grade View: Grade I Tube type: Oral Tube size: 7.0 mm Number of attempts: 1 Placement Confirmation: ETT inserted through vocal cords under direct vision,  positive ETCO2 and breath sounds checked- equal and bilateral Secured at: 21 cm Tube secured with: Tape Dental Injury: Teeth and Oropharynx as per pre-operative assessment

## 2013-08-25 NOTE — ED Notes (Signed)
OR team called in, Dr. Lovell Sheehan will see the pt at 1930, pt will go for surgery at 2000.

## 2013-08-25 NOTE — H&P (Signed)
Tammy Novak is an 42 y.o. female.   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 The pain has progressed throughout the day and is now concentrated in the right lower quadrant. CT scan the abdomen reveals acute appendicitis.  Past Medical History  Diagnosis Date  . Hypertension   . GERD (gastroesophageal reflux disease)   . Hiatal hernia     Past Surgical History  Procedure Laterality Date  . Cholecystectomy    . Tubal ligation      No family history on file. Social History:  reports that she has quit smoking. She does not have any smokeless tobacco history on file. She reports that she does not drink alcohol or use illicit drugs.  Allergies:  Allergies  Allergen Reactions  . Aspirin   . Sulfa Antibiotics      (Not in a hospital admission)  Results for orders placed during the hospital encounter of 08/25/13 (from the past 48 hour(s))  CBC WITH DIFFERENTIAL     Status: Abnormal   Collection Time    08/25/13  2:22 PM      Result Value Range   WBC 17.4 (*) 4.0 - 10.5 K/uL   RBC 4.98  3.87 - 5.11 MIL/uL   Hemoglobin 14.6  12.0 - 15.0 g/dL   HCT 25.3  66.4 - 40.3 %   MCV 87.8  78.0 - 100.0 fL   MCH 29.3  26.0 - 34.0 pg   MCHC 33.4  30.0 - 36.0 g/dL   RDW 47.4  25.9 - 56.3 %   Platelets 284  150 - 400 K/uL   Neutrophils Relative % 78 (*) 43 - 77 %   Neutro Abs 13.7 (*) 1.7 - 7.7 K/uL   Lymphocytes Relative 15  12 - 46 %   Lymphs Abs 2.5  0.7 - 4.0 K/uL   Monocytes Relative 7  3 - 12 %   Monocytes Absolute 1.2 (*) 0.1 - 1.0 K/uL   Eosinophils Relative 0  0 - 5 %   Eosinophils Absolute 0.0  0.0 - 0.7 K/uL   Basophils Relative 0  0 - 1 %   Basophils Absolute 0.0  0.0 - 0.1 K/uL  COMPREHENSIVE METABOLIC PANEL     Status: Abnormal   Collection Time    08/25/13  2:22 PM      Result Value Range   Sodium 134 (*)  135 - 145 mEq/L   Potassium 3.4 (*) 3.5 - 5.1 mEq/L   Chloride 96  96 - 112 mEq/L   CO2 23  19 - 32 mEq/L   Glucose, Bld 119 (*) 70 - 99 mg/dL   BUN 10  6 - 23 mg/dL   Creatinine, Ser 8.75  0.50 - 1.10 mg/dL   Calcium 9.7  8.4 - 64.3 mg/dL   Total Protein 8.4 (*) 6.0 - 8.3 g/dL   Albumin 4.0  3.5 - 5.2 g/dL   AST 19  0 - 37 U/L   ALT 21  0 - 35 U/L   Alkaline Phosphatase 92  39 - 117 U/L   Total Bilirubin 0.8  0.3 - 1.2 mg/dL   GFR calc non Af Amer >90  >90 mL/min   GFR calc Af Amer >90  >90 mL/min   Comment: (NOTE)     The eGFR has been calculated using the CKD EPI equation.  This calculation has not been validated in all clinical situations.     eGFR's persistently <90 mL/min signify possible Chronic Kidney     Disease.  LIPASE, BLOOD     Status: None   Collection Time    08/25/13  2:22 PM      Result Value Range   Lipase 27  11 - 59 U/L  URINALYSIS, ROUTINE W REFLEX MICROSCOPIC     Status: None   Collection Time    08/25/13  2:41 PM      Result Value Range   Color, Urine YELLOW  YELLOW   APPearance CLEAR  CLEAR   Specific Gravity, Urine 1.025  1.005 - 1.030   pH 6.0  5.0 - 8.0   Glucose, UA NEGATIVE  NEGATIVE mg/dL   Hgb urine dipstick NEGATIVE  NEGATIVE   Bilirubin Urine NEGATIVE  NEGATIVE   Ketones, ur NEGATIVE  NEGATIVE mg/dL   Protein, ur NEGATIVE  NEGATIVE mg/dL   Urobilinogen, UA 0.2  0.0 - 1.0 mg/dL   Nitrite NEGATIVE  NEGATIVE   Leukocytes, UA NEGATIVE  NEGATIVE   Comment: MICROSCOPIC NOT DONE ON URINES WITH NEGATIVE PROTEIN, BLOOD, LEUKOCYTES, NITRITE, OR GLUCOSE <1000 mg/dL.  PREGNANCY, URINE     Status: None   Collection Time    08/25/13  2:41 PM      Result Value Range   Preg Test, Ur NEGATIVE  NEGATIVE   Comment:            THE SENSITIVITY OF THIS     METHODOLOGY IS >20 mIU/mL.   Ct Abdomen Pelvis W Contrast  08/25/2013   CLINICAL DATA:  Abdomen pain  EXAM: CT ABDOMEN AND PELVIS WITH CONTRAST  TECHNIQUE: Multidetector CT imaging of the abdomen  and pelvis was performed using the standard protocol following bolus administration of intravenous contrast.  CONTRAST:  50mL OMNIPAQUE IOHEXOL 300 MG/ML SOLN, OMNIPAQUE IOHEXOL 300 MG/ML SOLN  COMPARISON:  None.  FINDINGS: The appendix is enlarged with adjacent inflammation consistent with appendicitis. There is no small bowel obstruction or diverticulitis.  There is diffuse fatty infiltration of liver. The patient is status post prior cholecystectomy. The spleen, pancreas, adrenal glands and kidneys are normal. There is no hydronephrosis bilaterally. There is umbilical herniation of mesenteric fat.  Fluid-filled bladder is normal. The uterus is normal. The visualized lung bases are clear. No acute abnormality is identified within the visualized bones.  IMPRESSION: Appendicitis.  I discussed the results over the phone with Dr. Donnetta Hutching on 5:31 p.m. August 25, 2013   Electronically Signed   By: Sherian Rein M.D.   On: 08/25/2013 17:32    Review of Systems  Constitutional: Positive for malaise/fatigue.  HENT: Negative.   Eyes: Negative.   Respiratory: Negative.   Cardiovascular: Negative.   Gastrointestinal: Positive for nausea and abdominal pain.  Genitourinary: Negative.   Musculoskeletal: Negative.   Skin: Negative.     Blood pressure 138/60, pulse 113, temperature 99.1 F (37.3 C), temperature source Oral, resp. rate 16, last menstrual period 07/29/2013, SpO2 98.00%. Physical Exam  Vitals reviewed. Constitutional: She is oriented to person, place, and time. She appears well-developed and well-nourished.  HENT:  Head: Normocephalic and atraumatic.  Neck: Normal range of motion. Neck supple.  Cardiovascular: Normal rate, regular rhythm and normal heart sounds.   Respiratory: Effort normal and breath sounds normal.  GI: Soft. There is tenderness. There is guarding. There is no rebound.  Tender in the right lower quadrant to palpation. No rigidity  noted.  Neurological: She is  alert and oriented to person, place, and time.  Skin: Skin is warm and dry.     Assessment/Plan Impression: Acute appendicitis Plan: Patient will be taken to the operating room for laparoscopic appendectomy. The risks and benefits of the procedure including bleeding, infection, and the possibility of an open procedure were fully explained to the patient, who gave informed consent.  Shuntavia Yerby A 08/25/2013, 7:18 PM

## 2013-08-25 NOTE — Transfer of Care (Signed)
Immediate Anesthesia Transfer of Care Note  Patient: Tammy Novak  Procedure(s) Performed: Procedure(s) (LRB): APPENDECTOMY LAPAROSCOPIC (N/A)  Patient Location: PACU  Anesthesia Type: General  Level of Consciousness: awake  Airway & Oxygen Therapy: Patient Spontanous Breathing and non-rebreather face mask  Post-op Assessment: Report given to PACU RN, Post -op Vital signs reviewed and stable and Patient moving all extremities  Post vital signs: Reviewed and stable  Complications: No apparent anesthesia complications

## 2013-08-25 NOTE — Anesthesia Preprocedure Evaluation (Signed)
Anesthesia Evaluation  Patient identified by MRN, date of birth, ID band Patient awake    Reviewed: Allergy & Precautions, H&P , NPO status , Patient's Chart, lab work & pertinent test results, reviewed documented beta blocker date and time   Airway Mallampati: II TM Distance: >3 FB Neck ROM: Full    Dental  (+) Teeth Intact   Pulmonary former smoker,    Pulmonary exam normal       Cardiovascular Exercise Tolerance: Good hypertension, Pt. on medications Rate:Tachycardia     Neuro/Psych    GI/Hepatic hiatal hernia, GERD-  Controlled,  Endo/Other    Renal/GU      Musculoskeletal   Abdominal   Peds  Hematology   Anesthesia Other Findings   Reproductive/Obstetrics                           Anesthesia Physical Anesthesia Plan  ASA: II and emergent  Anesthesia Plan: General   Post-op Pain Management:    Induction: Intravenous, Rapid sequence and Cricoid pressure planned  Airway Management Planned: Oral ETT  Additional Equipment:   Intra-op Plan:   Post-operative Plan: Extubation in OR  Informed Consent:   Dental advisory given  Plan Discussed with: Surgeon and Anesthesiologist  Anesthesia Plan Comments:         Anesthesia Quick Evaluation

## 2013-08-25 NOTE — ED Notes (Signed)
RLQ pain radiating all over abd starting last night.  C/o nausea and diarrhea, denies vomiting.

## 2013-08-25 NOTE — Op Note (Signed)
Patient:  Tammy Novak  DOB:  1971-03-16  MRN:  [id number]   Preop Diagnosis:  Acute appendicitis  Postop Diagnosis:  Same  Procedure:  Laparoscopic appendectomy  Surgeon:  Franky Macho, M.D.  Anes:  General endotracheal  Indications:  Patient is a 42 year old white female presents with a less than 24-hour history of worsening right lower quadrant abdominal pain. CT scan the abdomen reveals acute appendicitis. The risks and benefits of the procedure including bleeding, infection, and the possibility of an open procedure were fully explained to the patient, who gave informed consent.  Procedure note:  The patient is placed the supine position. After induction of general endotracheal anesthesia, the abdomen was prepped and draped using usual sterile technique with DuraPrep. Surgical site confirmation was performed.  A supraumbilical incision was made down to the fascia. A Veress needle was introduced into the abdominal cavity and confirmation of placement was done using the saline drop test. The abdomen was then insufflated to 16 mm mercury pressure. An 11 mm trocar was introduced into the abdominal cavity under direct visualization without difficulty. The patient was placed in deeper Trendelenburg position and an additional 12 mm trocar was placed the suprapubic region and a 5 mm trocar was placed left lower quadrant region. The appendix was visualized and noted to be diffusely inflamed. There was no evidence of perforation. The mesoappendix was divided using the harmonic scalpel. A vascular Endo GIA was placed across the base the appendix and fired. The staple line was inspected and noted within normal limits. The appendix was then removed using an Endo Catch bag without difficulty. A small amount of omentum was attached to the underside of the umbilicus. This was divided at the umbilical level using the harmonic scalpel. All fluid and air were then evacuated from the abdominal cavity prior to  removal of the trochars.  All wounds were irrigated with normal saline. All wounds were injected with 0.5% Sensorcaine. The supraumbilical fascia was reapproximated using an 0 Vicryl interrupted suture. All skin incisions were closed using staples. Betadine ointment and dry sterile dressings were applied.  All tape and needle counts were correct the end of the procedure. Patient was extubated in the operating room and transferred to PACU in stable condition.  Complications:  None  EBL:  Minimal  Specimen:  Appendix

## 2013-08-26 LAB — BASIC METABOLIC PANEL
BUN: 9 mg/dL (ref 6–23)
CO2: 25 mEq/L (ref 19–32)
Calcium: 8 mg/dL — ABNORMAL LOW (ref 8.4–10.5)
Creatinine, Ser: 0.7 mg/dL (ref 0.50–1.10)
GFR calc non Af Amer: >90 mL/min (ref >90)
Glucose, Bld: 122 mg/dL — ABNORMAL HIGH (ref 70–99)
Sodium: 136 mEq/L (ref 135–145)

## 2013-08-26 LAB — CBC
Hemoglobin: 11.2 g/dL — ABNORMAL LOW (ref 12.0–15.0)
MCH: 29.2 pg (ref 26.0–34.0)
MCHC: 32.8 g/dL (ref 30.0–36.0)
MCV: 88.8 fL (ref 78.0–100.0)
Platelets: 226 10*3/uL (ref 150–400)
RBC: 3.84 MIL/uL — ABNORMAL LOW (ref 3.87–5.11)

## 2013-08-26 MED ORDER — OXYCODONE-ACETAMINOPHEN 7.5-325 MG PO TABS: 1.0000 | ORAL_TABLET | ORAL | Status: DC | PRN

## 2013-08-26 NOTE — Progress Notes (Signed)
Discharge instructions and prescriptions given, verbalized understanding, out in stable condition with staff via w/c. 

## 2013-08-26 NOTE — Discharge Summary (Signed)
Physician Discharge Summary  Patient ID: Tammy Novak MRN: [id number] DOB/AGE: [birth year] 42 y.o.  Admit date: 08/25/2013 Discharge date: 08/26/2013  Admission Diagnoses: Acute appendicitis  Discharge Diagnoses: Same Active Problems:   Acute appendicitis   Discharged Condition: good  Hospital Course: Patient is a 42 year old white female presented emergency room with a less than 24-hour history of worsening right lower corner abdominal pain. CT scan the abdomen revealed acute appendicitis. She was taken to the operating room on 08/25/2013 and underwent a laparoscopic appendectomy. She tolerated the procedure well. Her postoperative course has been unremarkable. Her diet was advanced without difficulty. She is being discharged home in good improving condition.  Treatments: surgery: Laparoscopic appendectomy on 08/25/2013  Discharge Exam: Blood pressure 98/48, pulse 80, temperature 98.2 F (36.8 C), temperature source Oral, resp. rate 18, height 5\' 2"  (1.575 m), weight 119.75 kg (264 lb), last menstrual period 07/29/2013, SpO2 97.00%. General appearance: alert, cooperative and no distress Resp: clear to auscultation bilaterally Cardio: regular rate and rhythm, S1, S2 normal, no murmur, click, rub or gallop GI: Soft. Dressings dry and intact.  Disposition: 01-Home or Self Care     Medication List         ciprofloxacin 250 MG tablet  Commonly known as:  CIPRO  Take 250 mg by mouth 2 (two) times daily.     lisinopril-hydrochlorothiazide 20-12.5 MG per tablet  Commonly known as:  PRINZIDE,ZESTORETIC  Take 2 tablets by mouth daily.     loratadine 10 MG tablet  Commonly known as:  CLARITIN  Take 10 mg by mouth daily.     oxyCODONE-acetaminophen 7.5-325 MG per tablet  Commonly known as:  PERCOCET  Take 1-2 tablets by mouth every 4 (four) hours as needed.     promethazine 25 MG tablet  Commonly known as:  PHENERGAN  Take 1 tablet (25 mg total) by mouth every 6 (six) hours as  needed for nausea.     RABEprazole 20 MG tablet  Commonly known as:  ACIPHEX  Take 20 mg by mouth daily.           Follow-up Information   Follow up with Dalia Heading, MD. Schedule an appointment as soon as possible for a visit on 09/01/2013.   Specialty:  General Surgery   Contact information:   [address] [place] [address] [phone]       Signed: Franky Macho A 08/26/2013, 9:15 AM

## 2013-08-28 ENCOUNTER — Encounter (HOSPITAL_COMMUNITY): Payer: Self-pay | Admitting: General Surgery

## 2015-06-14 ENCOUNTER — Encounter (INDEPENDENT_AMBULATORY_CARE_PROVIDER_SITE_OTHER): Payer: Self-pay | Admitting: *Deleted

## 2015-07-21 ENCOUNTER — Ambulatory Visit (INDEPENDENT_AMBULATORY_CARE_PROVIDER_SITE_OTHER): Payer: Self-pay | Admitting: Internal Medicine

## 2015-08-23 ENCOUNTER — Ambulatory Visit (INDEPENDENT_AMBULATORY_CARE_PROVIDER_SITE_OTHER): Payer: BLUE CROSS/BLUE SHIELD | Admitting: Internal Medicine

## 2015-08-23 ENCOUNTER — Encounter (INDEPENDENT_AMBULATORY_CARE_PROVIDER_SITE_OTHER): Payer: Self-pay | Admitting: Internal Medicine

## 2015-08-23 VITALS — BP 112/70 | HR 76 | Temp 97.7°F | Ht 62.0 in | Wt 269.6 lb

## 2015-08-23 DIAGNOSIS — R197 Diarrhea, unspecified: Secondary | ICD-10-CM

## 2015-08-23 LAB — CBC WITH DIFFERENTIAL/PLATELET
BASOS ABS: 0 10*3/uL (ref 0.0–0.1)
Basophils Relative: 0 % (ref 0–1)
EOS PCT: 2 % (ref 0–5)
Eosinophils Absolute: 0.2 10*3/uL (ref 0.0–0.7)
HEMATOCRIT: 37.8 % (ref 36.0–46.0)
Hemoglobin: 12.2 g/dL (ref 12.0–15.0)
LYMPHS ABS: 3.5 10*3/uL (ref 0.7–4.0)
LYMPHS PCT: 35 % (ref 12–46)
MCH: 27.5 pg (ref 26.0–34.0)
MCHC: 32.3 g/dL (ref 30.0–36.0)
MCV: 85.1 fL (ref 78.0–100.0)
MPV: 10.5 fL (ref 8.6–12.4)
Monocytes Absolute: 0.7 10*3/uL (ref 0.1–1.0)
Monocytes Relative: 7 % (ref 3–12)
NEUTROS ABS: 5.5 10*3/uL (ref 1.7–7.7)
NEUTROS PCT: 56 % (ref 43–77)
PLATELETS: 337 10*3/uL (ref 150–400)
RBC: 4.44 MIL/uL (ref 3.87–5.11)
RDW: 15.1 % (ref 11.5–15.5)
WBC: 9.9 10*3/uL (ref 4.0–10.5)

## 2015-08-23 LAB — HEPATIC FUNCTION PANEL
ALBUMIN: 3.6 g/dL (ref 3.6–5.1)
ALT: 18 U/L (ref 6–29)
AST: 14 U/L (ref 10–35)
Alkaline Phosphatase: 68 U/L (ref 33–115)
BILIRUBIN TOTAL: 0.5 mg/dL (ref 0.2–1.2)
Bilirubin, Direct: 0.1 mg/dL (ref <=0.2)
Indirect Bilirubin: 0.4 mg/dL (ref 0.2–1.2)
Total Protein: 6.4 g/dL (ref 6.1–8.1)

## 2015-08-23 MED ORDER — DICYCLOMINE HCL 10 MG PO CAPS: 120.0000 mg | ORAL_CAPSULE | Freq: Three times a day (TID) | ORAL | Status: DC

## 2015-08-23 MED ORDER — DICYCLOMINE HCL 10 MG PO CAPS: 10.0000 mg | ORAL_CAPSULE | Freq: Three times a day (TID) | ORAL | Status: DC

## 2015-08-23 NOTE — Progress Notes (Signed)
   Subjective:    Patient ID: Tammy Novak, female    DOB: 02/06/1971, 44 y.o.   MRN: [id number]  HPI Referred by  Donnita FallsWendy Rodriguez NP-C  for chronic diarrhea. She says she has had diarrhea for over 10 years. She tells me over the summer, she has had diarrhea, crampy abdominal pain, nausea. Last episode was in September or August. She says she thinks it was from the cabbage she ate. She also says sometimes she will have chest pain after eating certain foods such as lettuce.  She tells me yesterday, she started to eat chicken and had severe lower abdominal pain, and ran to the BR to have a BM.  Sometimes she has to stop in the middle of a meal and run to the BR. Per records, stool was negative for O and P, wbc's, blood, bacteria, h. Pylori. She is having 2-3 stools a day and are loose. She rarely has formed stools.  She has seen Dr. Samuella CotaPandya for epigastric pain and says he found a hiatal hernia.  She has not seen [place] since 2009.  She has not tried anything for diarrhea except to avoid certain foods.   No family hx of colon cancer. She has never undergone a colonoscopy. Married. 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 Two children in good health.  Review of Systems Past Medical History  Diagnosis Date  . Hypertension   . GERD (gastroesophageal reflux disease)   . Hiatal hernia   . Diarrhea     Past Surgical History  Procedure Laterality Date  . Cholecystectomy    . Tubal ligation    . Laparoscopic appendectomy N/A 08/25/2013    Procedure: APPENDECTOMY LAPAROSCOPIC;  Surgeon: Dalia HeadingMark A Jenkins, MD;  Location: [facility];  Service: General;  Laterality: N/A;    Allergies  Allergen Reactions  . Aspirin   . Sulfa Antibiotics     Current Outpatient Prescriptions on File Prior to Visit  Medication Sig Dispense Refill  . lisinopril-hydrochlorothiazide (PRINZIDE,ZESTORETIC) 20-12.5 MG per tablet Take 2 tablets by mouth daily.     No current facility-administered medications on file prior to visit.       Objective:   Physical Exam Blood pressure 112/70, pulse 76, temperature 97.7 F (36.5 C), height 5\' 2"  (1.575 m), weight 269 lb 9.6 oz (122.29 kg).  Alert and oriented. Skin warm and dry. Oral mucosa is moist.   . Sclera anicteric, conjunctivae is pink. Thyroid not enlarged. No cervical lymphadenopathy. Lungs clear. Heart regular rate and rhythm.  Abdomen is soft. Bowel sounds are positive. No hepatomegaly. No abdominal masses felt. No tenderness. Abdomnen is obese No edema to lower extremities.  Stool brown and guaiac negative.   Lot [id number] Ex 9/17     Assessment & Plan:  Suspect we are dealing with IBS. Am going to try her on Dicyclomine QID and see how she does. Will bring back in 6 weeks. If not better will proceed with a colonoscopy. May also need an EGD. I offered her a colonoscopy and EGD today but she would rather wait till after the first of the year to see if the medications works.  CBC, Hepatic function today.

## 2015-08-23 NOTE — Patient Instructions (Signed)
Dicyclomine 10mg  QID. OV 6 weeks.

## 2015-10-04 ENCOUNTER — Ambulatory Visit (INDEPENDENT_AMBULATORY_CARE_PROVIDER_SITE_OTHER): Payer: Self-pay | Admitting: Internal Medicine

## 2016-04-14 ENCOUNTER — Emergency Department (HOSPITAL_COMMUNITY): Payer: BLUE CROSS/BLUE SHIELD

## 2016-04-14 ENCOUNTER — Other Ambulatory Visit: Payer: Self-pay

## 2016-04-14 ENCOUNTER — Emergency Department (HOSPITAL_COMMUNITY)
Admission: EM | Admit: 2016-04-14 | Discharge: 2016-04-14 | Disposition: A | Discharge status: Home / Self Care | Payer: BLUE CROSS/BLUE SHIELD | Attending: Emergency Medicine | Admitting: Emergency Medicine

## 2016-04-14 ENCOUNTER — Encounter (HOSPITAL_COMMUNITY): Payer: Self-pay | Admitting: Emergency Medicine

## 2016-04-14 DIAGNOSIS — I10 Essential (primary) hypertension: Secondary | ICD-10-CM | POA: Insufficient documentation

## 2016-04-14 DIAGNOSIS — Z79899 Other long term (current) drug therapy: Secondary | ICD-10-CM | POA: Diagnosis not present

## 2016-04-14 DIAGNOSIS — R079 Chest pain, unspecified: Secondary | ICD-10-CM | POA: Diagnosis present

## 2016-04-14 DIAGNOSIS — K449 Diaphragmatic hernia without obstruction or gangrene: Secondary | ICD-10-CM | POA: Diagnosis not present

## 2016-04-14 DIAGNOSIS — R0789 Other chest pain: Secondary | ICD-10-CM | POA: Insufficient documentation

## 2016-04-14 DIAGNOSIS — Z87891 Personal history of nicotine dependence: Secondary | ICD-10-CM | POA: Insufficient documentation

## 2016-04-14 LAB — CBC WITH DIFFERENTIAL/PLATELET
Basophils Absolute: 0 10*3/uL (ref 0.0–0.1)
Basophils Relative: 0 %
Eosinophils Absolute: 0.1 10*3/uL (ref 0.0–0.7)
Eosinophils Relative: 1 %
HCT: 37.8 % (ref 36.0–46.0)
Hemoglobin: 12.2 g/dL (ref 12.0–15.0)
LYMPHS PCT: 24 %
Lymphs Abs: 2.8 10*3/uL (ref 0.7–4.0)
MCH: 26.2 pg (ref 26.0–34.0)
MCHC: 32.3 g/dL (ref 30.0–36.0)
MCV: 81.1 fL (ref 78.0–100.0)
MONO ABS: 0.8 10*3/uL (ref 0.1–1.0)
Monocytes Relative: 7 %
NEUTROS ABS: 8.2 10*3/uL — AB (ref 1.7–7.7)
Neutrophils Relative %: 68 %
Platelets: 339 10*3/uL (ref 150–400)
RBC: 4.66 MIL/uL (ref 3.87–5.11)
RDW: 16.7 % — AB (ref 11.5–15.5)
WBC: 11.9 10*3/uL — ABNORMAL HIGH (ref 4.0–10.5)

## 2016-04-14 LAB — LIPASE, BLOOD: LIPASE: 31 U/L (ref 11–51)

## 2016-04-14 LAB — COMPREHENSIVE METABOLIC PANEL
ALK PHOS: 70 U/L (ref 38–126)
ALT: 19 U/L (ref 14–54)
AST: 19 U/L (ref 15–41)
Albumin: 3.8 g/dL (ref 3.5–5.0)
Anion gap: 6 (ref 5–15)
BUN: 13 mg/dL (ref 6–20)
CO2: 25 mmol/L (ref 22–32)
CREATININE: 0.67 mg/dL (ref 0.44–1.00)
Calcium: 9.2 mg/dL (ref 8.9–10.3)
Chloride: 106 mmol/L (ref 101–111)
GFR calc Af Amer: >60 mL/min (ref >60)
GLUCOSE: 112 mg/dL — AB (ref 65–99)
Potassium: 3.7 mmol/L (ref 3.5–5.1)
Sodium: 137 mmol/L (ref 135–145)
TOTAL PROTEIN: 7.5 g/dL (ref 6.5–8.1)
Total Bilirubin: 0.3 mg/dL (ref 0.3–1.2)

## 2016-04-14 LAB — TROPONIN I: Troponin I: <0.03 ng/mL (ref <0.03)

## 2016-04-14 LAB — I-STAT BETA HCG BLOOD, ED (MC, WL, AP ONLY)

## 2016-04-14 MED ORDER — GI COCKTAIL ~~LOC~~
30.0000 mL | Freq: Once | ORAL | Status: AC
  Administered 2016-04-14: 30 mL via ORAL
  Filled 2016-04-14: qty 30

## 2016-04-14 MED ORDER — GI COCKTAIL ~~LOC~~: 30.0000 mL | Freq: Two times a day (BID) | ORAL | Status: DC | PRN

## 2016-04-14 NOTE — ED Provider Notes (Signed)
CSN: [id number]     Arrival date & time 04/14/16  1241 History   First MD Initiated Contact with Patient 04/14/16 1252     Chief Complaint  Patient presents with  . Chest Pain     (Consider location/radiation/quality/duration/timing/severity/associated sxs/prior Treatment) HPI 34 her old female who presents with chest pain. She has a history of hypertension, hiatal hernia, and GERD. States having some mild GI issues over the past several months, and follows with Dr. Dionicia Abler from gastroenterology. History of appendectomy and cholecystectomy. States that while lying down at 9 AM this morning, she had sudden onset of severe burning chest pain in her lower chest that radiated to her back. No associating diaphoresis, nausea or vomiting, syncope or near syncope. Pain did not seem to change with activity or exertion. Symptoms not as severe with prior GI issues. No melena or hematochezia. No fever, cough, dyspnea.  Past Medical History  Diagnosis Date  . Hypertension   . GERD (gastroesophageal reflux disease)   . Hiatal hernia   . Diarrhea    Past Surgical History  Procedure Laterality Date  . Tubal ligation    . Laparoscopic appendectomy N/A 08/25/2013    Procedure: APPENDECTOMY LAPAROSCOPIC;  Surgeon: Dalia Heading, MD;  Location: [facility];  Service: General;  Laterality: N/A;  . Cholecystectomy      7-8 yrs ago for gallstones   History reviewed. No pertinent family history. Social History  Substance Use Topics  . Smoking status: Former Games developer  . Smokeless tobacco: None  . Alcohol Use: No   OB History    No data available     Review of Systems Physical Exam  Nursing note and vitals reviewed. Constitutional: Well developed, well nourished, non-toxic, and in no acute distress Head: Normocephalic and atraumatic.  Mouth/Throat: Oropharynx is clear and moist.  Neck: Normal range of motion. Neck supple.  Cardiovascular: Normal rate and regular rhythm.   Pulmonary/Chest: Effort normal  and breath sounds normal.  Abdominal: Soft. There is no tenderness. There is no rebound and no guarding.  Musculoskeletal: Normal range of motion.  Neurological: Alert, no facial droop, fluent speech, moves all extremities symmetrically Skin: Skin is warm and dry.  Psychiatric: Cooperative    Allergies  Aspirin; Morphine and related; and Sulfa antibiotics  Home Medications   Prior to Admission medications   Medication Sig Start Date End Date Taking? Authorizing Provider  ALPRAZolam Prudy Feeler) 0.5 MG tablet Take 0.5 mg by mouth 2 (two) times daily as needed for anxiety.   Yes Historical Provider, MD  lisinopril-hydrochlorothiazide (PRINZIDE,ZESTORETIC) 20-12.5 MG per tablet Take 2 tablets by mouth daily.   Yes Historical Provider, MD  omeprazole (PRILOSEC) 40 MG capsule Take 40 mg by mouth daily.   Yes Historical Provider, MD   BP 103/55 mmHg  Pulse 66  Temp(Src) 98.4 F (36.9 C) (Oral)  Resp 14  Ht 5\' 1"  (1.549 m)  Wt 256 lb (116.121 kg)  BMI 48.40 kg/m2  SpO2 97%  LMP 04/14/2016 Physical Exam Physical Exam  Nursing note and vitals reviewed. Constitutional: Well developed, well nourished, non-toxic, and in no acute distress Head: Normocephalic and atraumatic.  Mouth/Throat: Oropharynx is clear and moist.  Neck: Normal range of motion. Neck supple.  Cardiovascular: Normal rate and regular rhythm.   Pulmonary/Chest: Effort normal and breath sounds normal.  Abdominal: Soft. There is minimal epigastric tenderness. There is no rebound and no guarding.  Musculoskeletal: Normal range of motion.  Neurological: Alert, no facial droop, fluent speech, moves all  extremities symmetrically Skin: Skin is warm and dry.  Psychiatric: Cooperative  ED Course  Procedures (including critical care time) Labs Review Labs Reviewed  CBC WITH DIFFERENTIAL/PLATELET - Abnormal; Notable for the following:    WBC 11.9 (*)    RDW 16.7 (*)    Neutro Abs 8.2 (*)    All other components within normal  limits  COMPREHENSIVE METABOLIC PANEL - Abnormal; Notable for the following:    Glucose, Bld 112 (*)    All other components within normal limits  LIPASE, BLOOD  TROPONIN I  TROPONIN I  I-STAT BETA HCG BLOOD, ED ([facility], [facility], [facility] ONLY)    Imaging Review Dg Chest 2 View  04/14/2016  CLINICAL DATA:  Cough and epigastric pain. EXAM: CHEST  2 VIEW COMPARISON:  None. FINDINGS: The heart size and mediastinal contours are within normal limits. There is no evidence of pulmonary edema, consolidation, pneumothorax, nodule or pleural fluid. The visualized skeletal structures are unremarkable. IMPRESSION: No active cardiopulmonary disease. Electronically Signed   By: Irish Lack M.D.   On: 04/14/2016 13:59   I have personally reviewed and evaluated these images and lab results as part of my medical decision-making.   EKG Interpretation None      MDM   Final diagnoses:  Atypical chest pain  Hiatal hernia    89 and female with history of hypertension, GERD, and hiatal hernia who presents with epigastric and lower chest pain. She is nontoxic and in no acute distress. Her vital signs are within normal limits. Cardiopulmonary exam is unremarkable and her abdomen is benign. She is low risk for underlying ACS, with overall heart score of 2. Initial troponin is negative and EKG without acute ischemic changes. We'll perform serial troponins and EKG to look for dynamic changes. If negative would be adequately ruled out for ACS and can continue outpatient workup for this. Her chest x-ray shows no widened mediastinum or acute cardiopulmonary processes. History and presentation are not suggestive of dissection or PE. Suspect that he may be under lying GERD and hiatal hernia that is causing her symptoms. She is given GI cocktail with resolution of her symptoms.  If negative serial troponin and EKG, will be appropriate for discharge home.     Lavera Guise, MD 04/14/16 1450

## 2016-04-14 NOTE — ED Notes (Signed)
Having chest pain since yesterday, rates pain 5/10.  Pain is mid upper chest and shooting to back.  Denies any other symptoms.

## 2016-04-14 NOTE — Discharge Instructions (Signed)
Please return without fail for worsening symptoms, including worsening pain, intractable vomiting, difficulty breathing or any other symptoms concerning to you.  Nonspecific Chest Pain  Chest pain can be caused by many different conditions. There is always a chance that your pain could be related to something serious, such as a heart attack or a blood clot in your lungs. Chest pain can also be caused by conditions that are not life-threatening. If you have chest pain, it is very important to follow up with your health care provider. CAUSES  Chest pain can be caused by:  Heartburn.  Pneumonia or bronchitis.  Anxiety or stress.  Inflammation around your heart (pericarditis) or lung (pleuritis or pleurisy).  A blood clot in your lung.  A collapsed lung (pneumothorax). It can develop suddenly on its own (spontaneous pneumothorax) or from trauma to the chest.  Shingles infection (varicella-zoster virus).  Heart attack.  Damage to the bones, muscles, and cartilage that make up your chest wall. This can include:  Bruised bones due to injury.  Strained muscles or cartilage due to frequent or repeated coughing or overwork.  Fracture to one or more ribs.  Sore cartilage due to inflammation (costochondritis). RISK FACTORS  Risk factors for chest pain may include:  Activities that increase your risk for trauma or injury to your chest.  Respiratory infections or conditions that cause frequent coughing.  Medical conditions or overeating that can cause heartburn.  Heart disease or family history of heart disease.  Conditions or health behaviors that increase your risk of developing a blood clot.  Having had chicken pox (varicella zoster). SIGNS AND SYMPTOMS Chest pain can feel like:  Burning or tingling on the surface of your chest or deep in your chest.  Crushing, pressure, aching, or squeezing pain.  Dull or sharp pain that is worse when you move, cough, or take a deep  breath.  Pain that is also felt in your back, neck, shoulder, or arm, or pain that spreads to any of these areas. Your chest pain may come and go, or it may stay constant. DIAGNOSIS Lab tests or other studies may be needed to find the cause of your pain. Your health care provider may have you take a test called an ambulatory ECG (electrocardiogram). An ECG records your heartbeat patterns at the time the test is performed. You may also have other tests, such as:  Transthoracic echocardiogram (TTE). During echocardiography, sound waves are used to create a picture of all of the heart structures and to look at how blood flows through your heart.  Transesophageal echocardiogram (TEE).This is a more advanced imaging test that obtains images from inside your body. It allows your health care provider to see your heart in finer detail.  Cardiac monitoring. This allows your health care provider to monitor your heart rate and rhythm in real time.  Holter monitor. This is a portable device that records your heartbeat and can help to diagnose abnormal heartbeats. It allows your health care provider to track your heart activity for several days, if needed.  Stress tests. These can be done through exercise or by taking medicine that makes your heart beat more quickly.  Blood tests.  Imaging tests. TREATMENT  Your treatment depends on what is causing your chest pain. Treatment may include:  Medicines. These may include:  Acid blockers for heartburn.  Anti-inflammatory medicine.  Pain medicine for inflammatory conditions.  Antibiotic medicine, if an infection is present.  Medicines to dissolve blood clots.  Medicines to  treat coronary artery disease.  Supportive care for conditions that do not require medicines. This may include:  Resting.  Applying heat or cold packs to injured areas.  Limiting activities until pain decreases. HOME CARE INSTRUCTIONS  If you were prescribed an  antibiotic medicine, finish it all even if you start to feel better.  Avoid any activities that bring on chest pain.  Do not use any tobacco products, including cigarettes, chewing tobacco, or electronic cigarettes. If you need help quitting, ask your health care provider.  Do not drink alcohol.  Take medicines only as directed by your health care provider.  Keep all follow-up visits as directed by your health care provider. This is important. This includes any further testing if your chest pain does not go away.  If heartburn is the cause for your chest pain, you may be told to keep your head raised (elevated) while sleeping. This reduces the chance that acid will go from your stomach into your esophagus.  Make lifestyle changes as directed by your health care provider. These may include:  Getting regular exercise. Ask your health care provider to suggest some activities that are safe for you.  Eating a heart-healthy diet. A registered dietitian can help you to learn healthy eating options.  Maintaining a healthy weight.  Managing diabetes, if necessary.  Reducing stress. SEEK MEDICAL CARE IF:  Your chest pain does not go away after treatment.  You have a rash with blisters on your chest.  You have a fever. SEEK IMMEDIATE MEDICAL CARE IF:   Your chest pain is worse.  You have an increasing cough, or you cough up blood.  You have severe abdominal pain.  You have severe weakness.  You faint.  You have chills.  You have sudden, unexplained chest discomfort.  You have sudden, unexplained discomfort in your arms, back, neck, or jaw.  You have shortness of breath at any time.  You suddenly start to sweat, or your skin gets clammy.  You feel nauseous or you vomit.  You suddenly feel light-headed or dizzy.  Your heart begins to beat quickly, or it feels like it is skipping beats. These symptoms may represent a serious problem that is an emergency. Do not wait to  see if the symptoms will go away. Get medical help right away. Call your local emergency services (911 in the [place] Do not drive yourself to the hospital.   This information is not intended to replace advice given to you by your health care provider. Make sure you discuss any questions you have with your health care provider.   Document Released: 06/20/2005 Document Revised: 10/01/2014 Document Reviewed: 04/16/2014 Elsevier Interactive Patient Education 2016 [employer]  Gastroesophageal Reflux Disease, Adult Normally, food travels down the esophagus and stays in the stomach to be digested. However, when a person has gastroesophageal reflux disease (GERD), food and stomach acid move back up into the esophagus. When this happens, the esophagus becomes sore and inflamed. Over time, GERD can create small holes (ulcers) in the lining of the esophagus.  CAUSES This condition is caused by a problem with the muscle between the esophagus and the stomach (lower esophageal sphincter, or LES). Normally, the LES muscle closes after food passes through the esophagus to the stomach. When the LES is weakened or abnormal, it does not close properly, and that allows food and stomach acid to go back up into the esophagus. The LES can be weakened by certain dietary substances, medicines, and medical conditions, including:  Tobacco use.  Pregnancy.  Having a hiatal hernia.  Heavy alcohol use.  Certain foods and beverages, such as coffee, chocolate, onions, and peppermint. RISK FACTORS This condition is more likely to develop in:  People who have an increased body weight.  People who have connective tissue disorders.  People who use NSAID medicines. SYMPTOMS Symptoms of this condition include:  Heartburn.  Difficult or painful swallowing.  The feeling of having a lump in the throat.  Abitter taste in the mouth.  Bad breath.  Having a large amount of saliva.  Having an upset or bloated  stomach.  Belching.  Chest pain.  Shortness of breath or wheezing.  Ongoing (chronic) cough or a night-time cough.  Wearing away of tooth enamel.  Weight loss. Different conditions can cause chest pain. Make sure to see your health care provider if you experience chest pain. DIAGNOSIS Your health care provider will take a medical history and perform a physical exam. To determine if you have mild or severe GERD, your health care provider may also monitor how you respond to treatment. You may also have other tests, including:  An endoscopy toexamine your stomach and esophagus with a small camera.  A test thatmeasures the acidity level in your esophagus.  A test thatmeasures how much pressure is on your esophagus.  A barium swallow or modified barium swallow to show the shape, size, and functioning of your esophagus. TREATMENT The goal of treatment is to help relieve your symptoms and to prevent complications. Treatment for this condition may vary depending on how severe your symptoms are. Your health care provider may recommend:  Changes to your diet.  Medicine.  Surgery. HOME CARE INSTRUCTIONS Diet  Follow a diet as recommended by your health care provider. This may involve avoiding foods and drinks such as:  Coffee and tea (with or without caffeine).  Drinks that containalcohol.  Energy drinks and sports drinks.  Carbonated drinks or sodas.  Chocolate and cocoa.  Peppermint and mint flavorings.  Garlic and onions.  Horseradish.  Spicy and acidic foods, including peppers, chili powder, curry powder, vinegar, hot sauces, and barbecue sauce.  Citrus fruit juices and citrus fruits, such as oranges, lemons, and limes.  Tomato-based foods, such as red sauce, chili, salsa, and pizza with red sauce.  Fried and fatty foods, such as donuts, french fries, potato chips, and high-fat dressings.  High-fat meats, such as hot dogs and fatty cuts of red and white  meats, such as rib eye steak, sausage, ham, and bacon.  High-fat dairy items, such as whole milk, butter, and cream cheese.  Eat small, frequent meals instead of large meals.  Avoid drinking large amounts of liquid with your meals.  Avoid eating meals during the 2-3 hours before bedtime.  Avoid lying down right after you eat.  Do not exercise right after you eat. General Instructions  Pay attention to any changes in your symptoms.  Take over-the-counter and prescription medicines only as told by your health care provider. Do not take aspirin, ibuprofen, or other NSAIDs unless your health care provider told you to do so.  Do not use any tobacco products, including cigarettes, chewing tobacco, and e-cigarettes. If you need help quitting, ask your health care provider.  Wear loose-fitting clothing. Do not wear anything tight around your waist that causes pressure on your abdomen.  Raise (elevate) the head of your bed 6 inches (15cm).  Try to reduce your stress, such as with yoga or meditation. If you need  help reducing stress, ask your health care provider.  If you are overweight, reduce your weight to an amount that is healthy for you. Ask your health care provider for guidance about a safe weight loss goal.  Keep all follow-up visits as told by your health care provider. This is important. SEEK MEDICAL CARE IF:  You have new symptoms.  You have unexplained weight loss.  You have difficulty swallowing, or it hurts to swallow.  You have wheezing or a persistent cough.  Your symptoms do not improve with treatment.  You have a hoarse voice. SEEK IMMEDIATE MEDICAL CARE IF:  You have pain in your arms, neck, jaw, teeth, or back.  You feel sweaty, dizzy, or light-headed.  You have chest pain or shortness of breath.  You vomit and your vomit looks like blood or coffee grounds.  You faint.  Your stool is bloody or black.  You cannot swallow, drink, or eat.   This  information is not intended to replace advice given to you by your health care provider. Make sure you discuss any questions you have with your health care provider.   Document Released: 06/20/2005 Document Revised: 06/01/2015 Document Reviewed: 01/05/2015 Elsevier Interactive Patient Education [employer]

## 2016-05-03 ENCOUNTER — Encounter (HOSPITAL_COMMUNITY)
Admission: RE | Admit: 2016-05-03 | Discharge: 2016-05-03 | Disposition: A | Discharge status: Home / Self Care | Payer: BLUE CROSS/BLUE SHIELD | Source: Ambulatory Visit | Attending: Internal Medicine | Admitting: Internal Medicine

## 2016-05-03 ENCOUNTER — Encounter (HOSPITAL_COMMUNITY): Payer: Self-pay

## 2016-05-03 ENCOUNTER — Ambulatory Visit (INDEPENDENT_AMBULATORY_CARE_PROVIDER_SITE_OTHER): Payer: BLUE CROSS/BLUE SHIELD | Admitting: Internal Medicine

## 2016-05-03 ENCOUNTER — Encounter (INDEPENDENT_AMBULATORY_CARE_PROVIDER_SITE_OTHER): Payer: Self-pay | Admitting: Internal Medicine

## 2016-05-03 ENCOUNTER — Other Ambulatory Visit (INDEPENDENT_AMBULATORY_CARE_PROVIDER_SITE_OTHER): Payer: Self-pay | Admitting: Internal Medicine

## 2016-05-03 VITALS — BP 132/82 | HR 72 | Temp 98.1°F | Ht 61.0 in | Wt 262.4 lb

## 2016-05-03 DIAGNOSIS — K219 Gastro-esophageal reflux disease without esophagitis: Secondary | ICD-10-CM | POA: Diagnosis not present

## 2016-05-03 HISTORY — DX: Anxiety disorder, unspecified: F41.9

## 2016-05-03 MED ORDER — PANTOPRAZOLE SODIUM 40 MG PO TBEC: 40.0000 mg | DELAYED_RELEASE_TABLET | Freq: Every day | ORAL | 5 refills | Status: DC

## 2016-05-03 NOTE — Patient Instructions (Signed)
Rx for Protonix sent to her phamacy.  EGD. The risks and benefits such as perforation, bleeding, and infection were reviewed with the patient and is agreeable.

## 2016-05-03 NOTE — Progress Notes (Signed)
   Subjective:    Patient ID: Tammy Novak, female    DOB: 1971/03/16, 45 y.o.   MRN: [id number]  HPI Here today for f/u. Recently seen in the ED with chest pain in July. She tells me she ate a chick filet and about 2 hrs later she started to have chest pain. Troponins x 2 were negative. EKG was normal. GI cocktail relieved her pain. She was seen in November by me. She says she is careful what she eats. She c/o today of epigastric pain (burning). She says she has the pain daily. She says the pain will go straight thru to her back. The pain will last 6-8 hrs. She has tried Mylanta which did not help. She takes Bentyl as needed. She says it helped.  She says the pain is "really bad". [place] Samuella CotaPandya.S She says it showed a hiatal hernia. She would like to proceed with an EGD. She says she has difficulty being sedated with Fentanyl and Versed. She has a hx of chronic diarrhea. She has a BM about 2-3 times a day. Takes Bentyl as needed. Stools are soft or runny, and sometimes explosive a time. Probably hx of IBS.  If she watches what she eats, her BMs are better.  She know what foods to avoid.     Review of Systems Past Medical History:  Diagnosis Date  . Diarrhea   . GERD (gastroesophageal reflux disease)   . Hiatal hernia   . Hypertension     Past Surgical History:  Procedure Laterality Date  . CHOLECYSTECTOMY     7-8 yrs ago for gallstones  . LAPAROSCOPIC APPENDECTOMY N/A 08/25/2013   Procedure: APPENDECTOMY LAPAROSCOPIC;  Surgeon: Dalia HeadingMark A Jenkins, MD;  Location: [facility];  Service: General;  Laterality: N/A;  . TUBAL LIGATION      Allergies  Allergen Reactions  . Aspirin   . Morphine And Related   . Sulfa Antibiotics     Current Outpatient Prescriptions on File Prior to Visit  Medication Sig Dispense Refill  . ALPRAZolam (XANAX) 0.5 MG tablet Take 0.5 mg by mouth 2 (two) times daily as needed for anxiety.    . Alum & Mag Hydroxide-Simeth (GI  COCKTAIL) SUSP suspension Take 30 mLs by mouth 2 (two) times daily as needed for indigestion. Shake well. 360 mL 0  . lisinopril-hydrochlorothiazide (PRINZIDE,ZESTORETIC) 20-12.5 MG per tablet Take 1 tablet by mouth daily.     Marland Kitchen. omeprazole (PRILOSEC) 40 MG capsule Take 40 mg by mouth daily.     No current facility-administered medications on file prior to visit.        Objective:   Physical Exam Blood pressure 132/82, pulse 72, temperature 98.1 F (36.7 C), height 5\' 1"  (1.549 m), weight 262 lb 6.4 oz (119 kg), last menstrual period 04/14/2016. Alert and oriented. Skin warm and dry. Oral mucosa is moist.   . Sclera anicteric, conjunctivae is pink. Thyroid not enlarged. No cervical lymphadenopathy. Lungs clear. Heart regular rate and rhythm.  Abdomen is soft. Bowel sounds are positive. No hepatomegaly. No abdominal masses felt. No tenderness.  No edema to lower extremities.   She does not take NSAIDs.         Assessment & Plan:  GERD: breakthru. Will switch to Protonix and see how she does. EGD. The risks and benefits such as perforation, bleeding, and infection were reviewed with the patient and is agreeable.

## 2016-05-07 ENCOUNTER — Encounter (HOSPITAL_COMMUNITY)
Admission: RE | Disposition: A | Discharge status: Home / Self Care | Payer: Self-pay | Source: Ambulatory Visit | Attending: Internal Medicine

## 2016-05-07 ENCOUNTER — Ambulatory Visit (HOSPITAL_COMMUNITY): Payer: BLUE CROSS/BLUE SHIELD | Admitting: Anesthesiology

## 2016-05-07 ENCOUNTER — Encounter (HOSPITAL_COMMUNITY): Payer: Self-pay | Admitting: *Deleted

## 2016-05-07 ENCOUNTER — Ambulatory Visit (HOSPITAL_COMMUNITY)
Admission: RE | Admit: 2016-05-07 | Discharge: 2016-05-07 | Disposition: A | Discharge status: Home / Self Care | Payer: BLUE CROSS/BLUE SHIELD | Source: Ambulatory Visit | Attending: Internal Medicine | Admitting: Internal Medicine

## 2016-05-07 DIAGNOSIS — Z87891 Personal history of nicotine dependence: Secondary | ICD-10-CM | POA: Diagnosis not present

## 2016-05-07 DIAGNOSIS — K449 Diaphragmatic hernia without obstruction or gangrene: Secondary | ICD-10-CM | POA: Diagnosis not present

## 2016-05-07 DIAGNOSIS — K529 Noninfective gastroenteritis and colitis, unspecified: Secondary | ICD-10-CM | POA: Insufficient documentation

## 2016-05-07 DIAGNOSIS — Z79899 Other long term (current) drug therapy: Secondary | ICD-10-CM | POA: Insufficient documentation

## 2016-05-07 DIAGNOSIS — K210 Gastro-esophageal reflux disease with esophagitis: Secondary | ICD-10-CM | POA: Insufficient documentation

## 2016-05-07 DIAGNOSIS — R1013 Epigastric pain: Secondary | ICD-10-CM | POA: Insufficient documentation

## 2016-05-07 DIAGNOSIS — Z9049 Acquired absence of other specified parts of digestive tract: Secondary | ICD-10-CM | POA: Diagnosis not present

## 2016-05-07 DIAGNOSIS — K319 Disease of stomach and duodenum, unspecified: Secondary | ICD-10-CM | POA: Diagnosis not present

## 2016-05-07 DIAGNOSIS — K2970 Gastritis, unspecified, without bleeding: Secondary | ICD-10-CM | POA: Diagnosis not present

## 2016-05-07 DIAGNOSIS — K228 Other specified diseases of esophagus: Secondary | ICD-10-CM | POA: Diagnosis not present

## 2016-05-07 DIAGNOSIS — R0789 Other chest pain: Secondary | ICD-10-CM | POA: Diagnosis not present

## 2016-05-07 DIAGNOSIS — K317 Polyp of stomach and duodenum: Secondary | ICD-10-CM | POA: Diagnosis not present

## 2016-05-07 DIAGNOSIS — I10 Essential (primary) hypertension: Secondary | ICD-10-CM | POA: Diagnosis not present

## 2016-05-07 DIAGNOSIS — F419 Anxiety disorder, unspecified: Secondary | ICD-10-CM | POA: Insufficient documentation

## 2016-05-07 DIAGNOSIS — K219 Gastro-esophageal reflux disease without esophagitis: Secondary | ICD-10-CM | POA: Insufficient documentation

## 2016-05-07 HISTORY — PX: ESOPHAGOGASTRODUODENOSCOPY (EGD) WITH PROPOFOL: SHX5813

## 2016-05-07 HISTORY — PX: BIOPSY: SHX5522

## 2016-05-07 SURGERY — ESOPHAGOGASTRODUODENOSCOPY (EGD) WITH PROPOFOL
Anesthesia: Monitor Anesthesia Care

## 2016-05-07 MED ORDER — GLYCOPYRROLATE 0.2 MG/ML IJ SOLN
INTRAMUSCULAR | Status: AC
  Filled 2016-05-07: qty 1

## 2016-05-07 MED ORDER — MIDAZOLAM HCL 2 MG/2ML IJ SOLN
1.0000 mg | INTRAMUSCULAR | Status: DC | PRN
  Administered 2016-05-07: 2 mg via INTRAVENOUS

## 2016-05-07 MED ORDER — BUTAMBEN-TETRACAINE-BENZOCAINE 2-2-14 % EX AERO
1.0000 | INHALATION_SPRAY | Freq: Once | CUTANEOUS | Status: AC
  Administered 2016-05-07: 1 via TOPICAL

## 2016-05-07 MED ORDER — HYDROMORPHONE HCL 1 MG/ML IJ SOLN: 0.2500 mg | INTRAMUSCULAR | Status: DC | PRN

## 2016-05-07 MED ORDER — CHLORHEXIDINE GLUCONATE CLOTH 2 % EX PADS
6.0000 | MEDICATED_PAD | Freq: Once | CUTANEOUS | Status: DC
Start: 2016-05-07 — End: 2016-05-07

## 2016-05-07 MED ORDER — FENTANYL CITRATE (PF) 100 MCG/2ML IJ SOLN
25.0000 ug | INTRAMUSCULAR | Status: AC | PRN
  Administered 2016-05-07 (×2): 25 ug via INTRAVENOUS

## 2016-05-07 MED ORDER — FENTANYL CITRATE (PF) 100 MCG/2ML IJ SOLN
INTRAMUSCULAR | Status: AC
  Filled 2016-05-07: qty 2

## 2016-05-07 MED ORDER — LACTATED RINGERS IV SOLN
INTRAVENOUS | Status: DC
  Administered 2016-05-07: 08:00:00 via INTRAVENOUS

## 2016-05-07 MED ORDER — CHLORHEXIDINE GLUCONATE CLOTH 2 % EX PADS: 6.0000 | MEDICATED_PAD | Freq: Once | CUTANEOUS | Status: DC

## 2016-05-07 MED ORDER — PROPOFOL 500 MG/50ML IV EMUL
INTRAVENOUS | Status: DC | PRN
  Administered 2016-05-07: 08:00:00 via INTRAVENOUS
  Administered 2016-05-07: 150 ug/kg/min via INTRAVENOUS

## 2016-05-07 MED ORDER — LIDOCAINE HCL (PF) 1 % IJ SOLN
INTRAMUSCULAR | Status: AC
  Filled 2016-05-07: qty 5

## 2016-05-07 MED ORDER — MIDAZOLAM HCL 2 MG/2ML IJ SOLN
INTRAMUSCULAR | Status: AC
  Filled 2016-05-07: qty 2

## 2016-05-07 NOTE — Anesthesia Procedure Notes (Signed)
Procedure Name: MAC Date/Time: 05/07/2016 8:14 AM Performed by: Pernell DupreADAMS, AMY A Pre-anesthesia Checklist: Patient identified, Suction available, Emergency Drugs available and Patient being monitored Oxygen Delivery Method: Simple face mask

## 2016-05-07 NOTE — H&P (Signed)
Tammy Novak is an 45 y.o. female.   Chief Complaint: Patient is here for EGD. HPI: Patient is [id number] old Caucasian female who presents with recurrent chest and epigastric pain. She was recently in emergency room and troponin levels were normal. 20th history of heartburn she states she is not having heartburn. She has been on omeprazole. She was switched pantoprazole last week but cannot tell any difference. She is watching her diet. She does not smoke cigarettes. She does not take OTC NSAIDs. She also has chronic diarrhea presumed to be due to IBS. Last EGD was 8 years ago revealing small sliding hiatal hernia. [place].  Past Medical History:  Diagnosis Date  . Anxiety   . Diarrhea   . GERD (gastroesophageal reflux disease)   . Hiatal hernia   . Hypertension     Past Surgical History:  Procedure Laterality Date  . APPENDECTOMY    . CHOLECYSTECTOMY     7-8 yrs ago for gallstones  . LAPAROSCOPIC APPENDECTOMY N/A 08/25/2013   Procedure: APPENDECTOMY LAPAROSCOPIC;  Surgeon: Dalia HeadingMark A Jenkins, MD;  Location: [facility];  Service: General;  Laterality: N/A;  . TUBAL LIGATION      History reviewed. No pertinent family history. Social History:  reports that she quit smoking about 10 years ago. She has a 15.00 pack-year smoking history. She has never used smokeless tobacco. She reports that she does not drink alcohol or use drugs.  Allergies:  Allergies  Allergen Reactions  . Aspirin   . Morphine And Related   . Sulfa Antibiotics     Medications Prior to Admission  Medication Sig Dispense Refill  . ALPRAZolam (XANAX) 0.25 MG tablet Take 0.25 mg by mouth at bedtime as needed for anxiety.    . Alum & Mag Hydroxide-Simeth (GI COCKTAIL) SUSP suspension Take 30 mLs by mouth 2 (two) times daily as needed for indigestion. Shake well. 360 mL 0  . lisinopril-hydrochlorothiazide (PRINZIDE,ZESTORETIC) 20-12.5 MG per tablet Take 1 tablet by mouth daily.     . pantoprazole (PROTONIX) 40  MG tablet Take 1 tablet (40 mg total) by mouth daily. 30 tablet 5    No results found for this or any previous visit (from the past 48 hour(s)). No results found.  ROS  Blood pressure 134/76, temperature 97.9 F (36.6 C), resp. rate 19, last menstrual period 04/14/2016, SpO2 98 %. Physical Exam  Constitutional:  Well-developed obese Caucasian female in NAD.  HENT:  Mouth/Throat: Oropharynx is clear and moist.  Eyes: Conjunctivae are normal. No scleral icterus.  Neck: No thyromegaly present.  Cardiovascular: Normal rate, regular rhythm and normal heart sounds.   No murmur heard. Respiratory: Effort normal and breath sounds normal.  GI:  Abdomen is full but soft and nontender without organomegaly or masses.  Musculoskeletal: She exhibits no edema.  Lymphadenopathy:    She has no cervical adenopathy.  Neurological: She is alert.  Skin: Skin is warm and dry.     Assessment/Plan Noncardiac chest pain and epigastric pain. Chronic GERD. Chronic diarrhea. Diagnostic EGD.  Lionel DecemberNajeeb Rehman, MD 05/07/2016, 8:14 AM

## 2016-05-07 NOTE — Op Note (Signed)
[facility] Patient Name: Tammy Novak Procedure Date: 05/07/2016 8:06 AM MRN: [id number] Date of Birth: [birth year] Attending MD: Lionel December , MD CSN: [id number] Age: 45 Admit Type: Outpatient Procedure:                Upper GI endoscopy Indications:              Epigastric abdominal pain, Reflux esophagitis,                            Chest pain (non cardiac) Providers:                Lionel December, MD, Loma Messing B. Patsy Lager, RN, Burke Keels, Technician Referring MD:             Donnita Falls, MD Medicines:                Cetacaine spray, Propofol per Anesthesia Complications:            No immediate complications. Estimated Blood Loss:     Estimated blood loss was minimal. Procedure:                Pre-Anesthesia Assessment:                           - Prior to the procedure, a History and Physical                            was performed, and patient medications and                            allergies were reviewed. The patient's tolerance of                            previous anesthesia was also reviewed. The risks                            and benefits of the procedure and the sedation                            options and risks were discussed with the patient.                            All questions were answered, and informed consent                            was obtained. Prior Anticoagulants: The patient has                            taken no previous anticoagulant or antiplatelet                            agents. ASA Grade Assessment: II - A patient with  mild systemic disease. After reviewing the risks                            and benefits, the patient was deemed in                            satisfactory condition to undergo the procedure.                           After obtaining informed consent, the endoscope was                            passed under direct vision. Throughout the              procedure, the patient's blood pressure, pulse, and                            oxygen saturations were monitored continuously. The                            [id number] ([id number] scope was introduced through the                            mouth, and advanced to the second part of duodenum.                            The upper GI endoscopy was accomplished without                            difficulty. The patient tolerated the procedure                            well. Scope In: 8:21:10 AM Scope Out: 8:30:10 AM Total Procedure Duration: 0 hours 9 minutes 0 seconds  Findings:      The examined esophagus was normal.      The Z-line was irregular and was found 31 cm from the incisors.      A 4 cm hiatal hernia was present.      Patchy mild inflammation characterized by congestion (edema), erythema       and granularity was found in the gastric antrum. Biopsies were taken       with a cold forceps for histology.      Few small 3 to 5 mm sessile polyps with no bleeding and no stigmata of       recent bleeding was found in the gastric body and fundus.      The duodenal bulb and second portion of the duodenum were normal.       Biopsies were taken with a cold forceps for histology. Biopsies for       histology were taken with a cold forceps for evaluation of celiac       disease. Impression:               - Normal esophagus.                           - Z-line irregular, 31 cm  from the incisors.                           - 4 cm hiatal hernia.                           - Gastritis. Biopsied.                           - Few small gastric polyps at gastric body and                            fundus with features of hyperplastic polyps. These                            were left alone.                           - Normal duodenal bulb and second portion of the                            duodenum. Biopsied. Moderate Sedation:      Per Anesthesia Care Recommendation:           - Patient  has a contact number available for                            emergencies. The signs and symptoms of potential                            delayed complications were discussed with the                            patient. Return to normal activities tomorrow.                            Written discharge instructions were provided to the                            patient.                           - Resume previous diet today.                           - Continue present medications.                           - Await pathology results. Procedure Code(s):        --- Professional ---                           [id number], Esophagogastroduodenoscopy, flexible,                            transoral; with biopsy, single or multiple Diagnosis Code(s):        --- Professional ---  K22.8, Other specified diseases of esophagus                           K44.9, Diaphragmatic hernia without obstruction or                            gangrene                           K29.70, Gastritis, unspecified, without bleeding                           K31.7, Polyp of stomach and duodenum                           R10.13, Epigastric pain                           K21.0, Gastro-esophageal reflux disease with                            esophagitis                           R07.89, Other chest pain CPT copyright 2016 [employer]. All rights reserved. The codes documented in this report are preliminary and upon coder review may  be revised to meet current compliance requirements. Lionel December, MD Lionel December, MD 05/07/2016 8:56:02 AM This report has been signed electronically. Number of Addenda: 0

## 2016-05-07 NOTE — Anesthesia Postprocedure Evaluation (Signed)
Anesthesia Post Note  Patient: Tammy Novak  Procedure(s) Performed: Procedure(s) (LRB): ESOPHAGOGASTRODUODENOSCOPY (EGD) WITH PROPOFOL (N/A) BIOPSY  Patient location during evaluation: PACU Anesthesia Type: MAC Level of consciousness: awake and alert and oriented Pain management: pain level controlled Vital Signs Assessment: post-procedure vital signs reviewed and stable Respiratory status: spontaneous breathing Cardiovascular status: stable Postop Assessment: no signs of nausea or vomiting Anesthetic complications: no    Last Vitals:  Vitals:   05/07/16 0845 05/07/16 0900  BP: (!) 88/44 (!) 88/47  Pulse: 72 62  Resp: 16 15  Temp:      Last Pain: There were no vitals filed for this visit.               Cristle Jared A

## 2016-05-07 NOTE — Anesthesia Preprocedure Evaluation (Signed)
Anesthesia Evaluation  Patient identified by MRN, date of birth, ID band Patient awake    Reviewed: Allergy & Precautions, H&P , NPO status , Patient's Chart, lab work & pertinent test results, reviewed documented beta blocker date and time   Airway Mallampati: II  TM Distance: >3 FB Neck ROM: Full    Dental  (+) Teeth Intact   Pulmonary former smoker,    Pulmonary exam normal        Cardiovascular Exercise Tolerance: Good hypertension, Pt. on medications  Rhythm:Regular Rate:Normal     Neuro/Psych PSYCHIATRIC DISORDERS Anxiety    GI/Hepatic hiatal hernia, GERD  Controlled,  Endo/Other    Renal/GU      Musculoskeletal   Abdominal   Peds  Hematology   Anesthesia Other Findings   Reproductive/Obstetrics                             Anesthesia Physical Anesthesia Plan  ASA: II  Anesthesia Plan: MAC   Post-op Pain Management:    Induction: Intravenous  Airway Management Planned: Simple Face Mask  Additional Equipment:   Intra-op Plan:   Post-operative Plan:   Informed Consent: I have reviewed the patients History and Physical, chart, labs and discussed the procedure including the risks, benefits and alternatives for the proposed anesthesia with the patient or authorized representative who has indicated his/her understanding and acceptance.     Plan Discussed with:   Anesthesia Plan Comments:         Anesthesia Quick Evaluation

## 2016-05-07 NOTE — Transfer of Care (Signed)
Immediate Anesthesia Transfer of Care Note  Patient: Tammy Novak  Procedure(s) Performed: Procedure(s) with comments: ESOPHAGOGASTRODUODENOSCOPY (EGD) WITH PROPOFOL (N/A) BIOPSY - gastric; duodenum  Patient Location: PACU  Anesthesia Type:MAC  Level of Consciousness: awake, alert , oriented and patient cooperative  Airway & Oxygen Therapy: Patient Spontanous Breathing and Patient connected to nasal cannula oxygen  Post-op Assessment: Report given to RN and Post -op Vital signs reviewed and stable  Post vital signs: Reviewed and stable  Last Vitals:  Vitals:   05/07/16 0800 05/07/16 0805  BP:  (P) 134/76  Resp: 15 19  Temp:      Last Pain: There were no vitals filed for this visit.       Complications: No apparent anesthesia complications

## 2016-05-07 NOTE — Discharge Instructions (Signed)
Resume usual medications and diet. °No driving for 24 hours. °Physician will call with biopsy results. ° ° °Esophagogastroduodenoscopy, Care After °Refer to this sheet in the next few weeks. These instructions provide you with information about caring for yourself after your procedure. Your health care provider may also give you more specific instructions. Your treatment has been planned according to current medical practices, but problems sometimes occur. Call your health care provider if you have any problems or questions after your procedure. °WHAT TO EXPECT AFTER THE PROCEDURE °After your procedure, it is typical to feel: °· Soreness in your throat. °· Pain with swallowing. °· Sick to your stomach (nauseous). °· Bloated. °· Dizzy. °· Fatigued. °HOME CARE INSTRUCTIONS °· Do not eat or drink anything until the numbing medicine (local anesthetic) has worn off and your gag reflex has returned. You will know that the local anesthetic has worn off when you can swallow comfortably. °· Do not drive or operate machinery until directed by your health care provider. °· Take medicines only as directed by your health care provider. °SEEK MEDICAL CARE IF:  °· You cannot stop coughing. °· You are not urinating at all or less than usual. °SEEK IMMEDIATE MEDICAL CARE IF: °· You have difficulty swallowing. °· You cannot eat or drink. °· You have worsening throat or chest pain. °· You have dizziness or lightheadedness or you faint. °· You have nausea or vomiting. °· You have chills. °· You have a fever. °· You have severe abdominal pain. °· You have black, tarry, or bloody stools. °  °This information is not intended to replace advice given to you by your health care provider. Make sure you discuss any questions you have with your health care provider. °  °Document Released: 08/27/2012 Document Revised: 10/01/2014 Document Reviewed: 08/27/2012 °Elsevier Interactive Patient Education ©2016 [employer] ° °

## 2016-05-10 ENCOUNTER — Encounter (HOSPITAL_COMMUNITY): Payer: Self-pay | Admitting: Internal Medicine

## 2016-10-29 IMAGING — DX DG CHEST 2V
2 series · 2 of 2 positions shown · non-contrast
Comparison: None.

CLINICAL DATA: Cough and epigastric pain.

EXAM:
CHEST  2 VIEW

[chest pa]
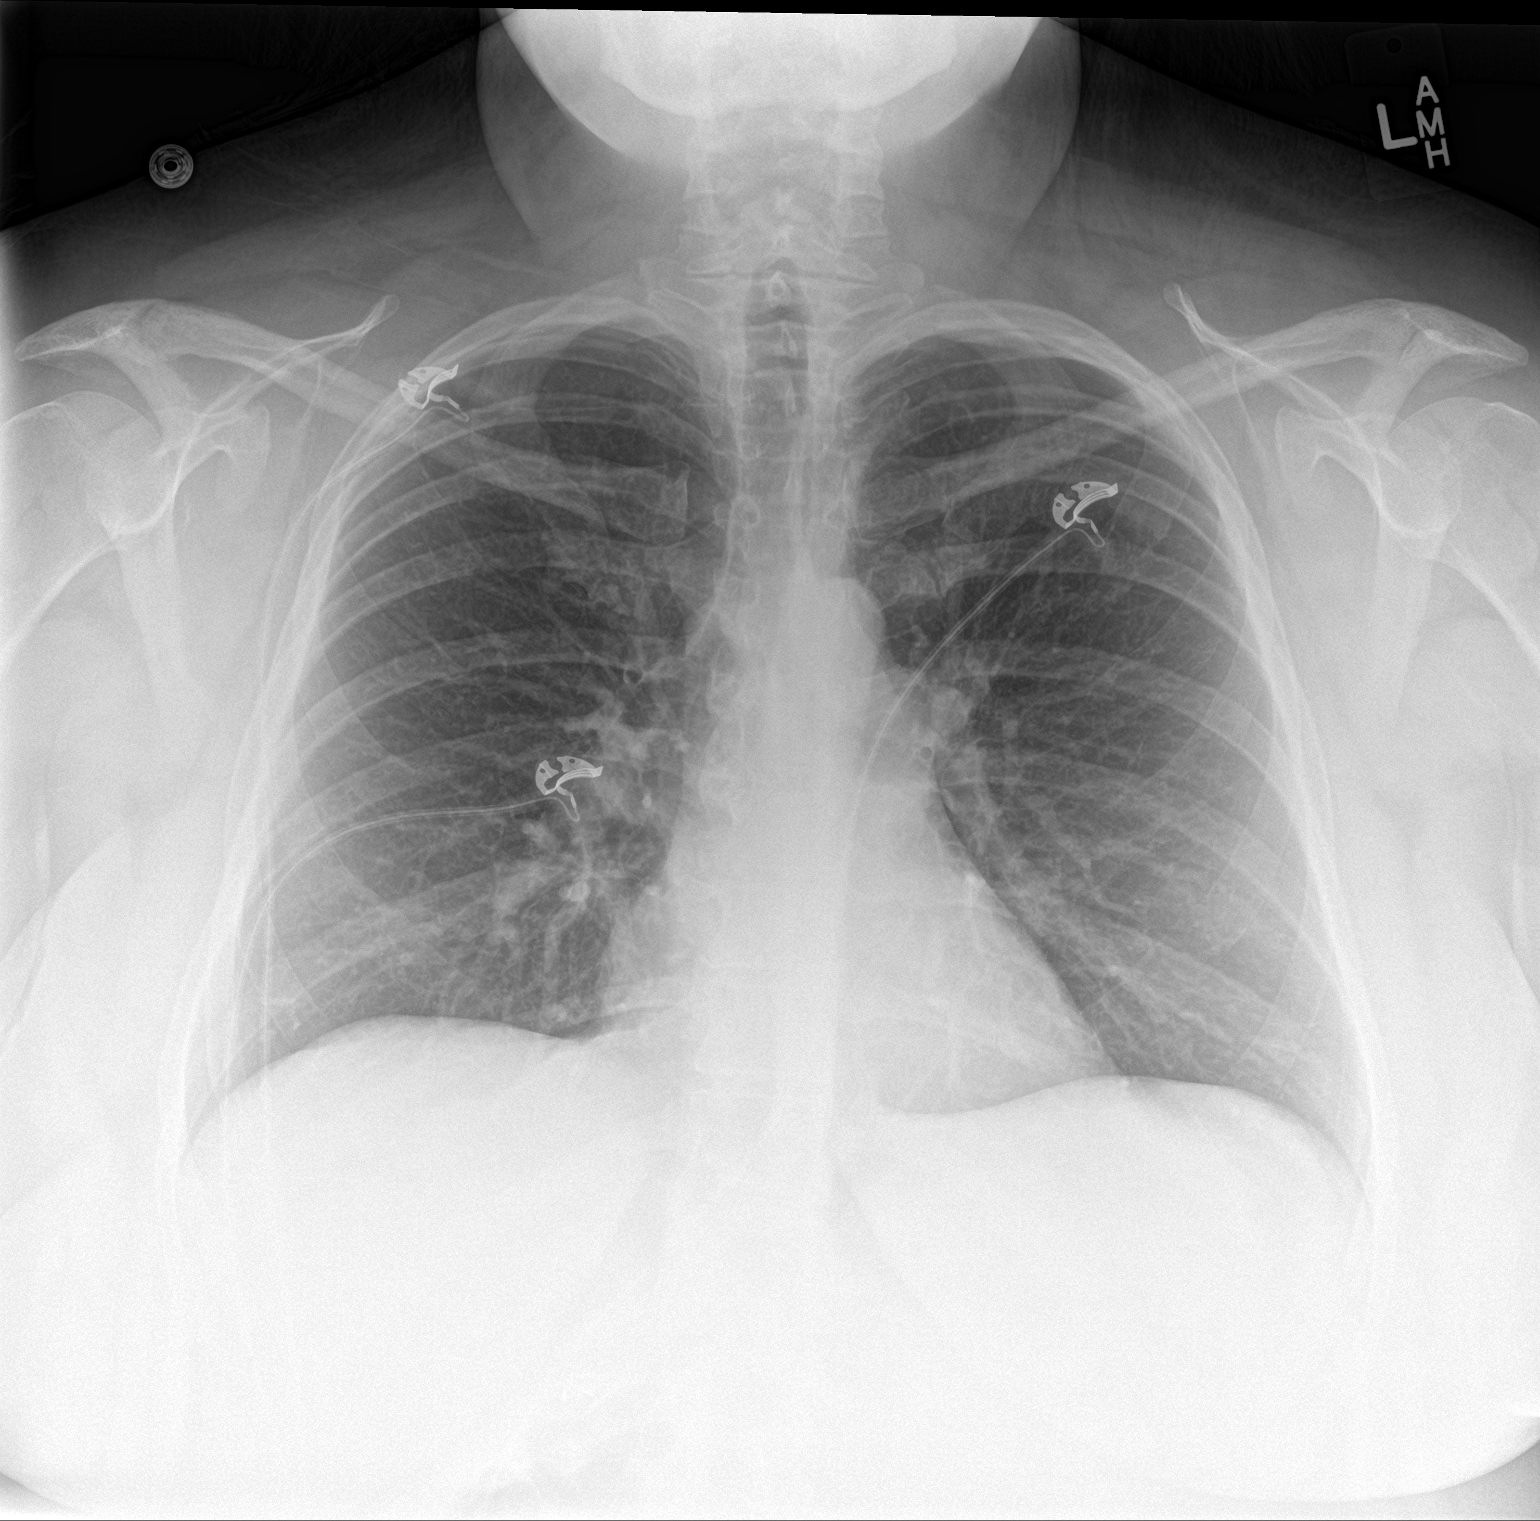

[chest lat]
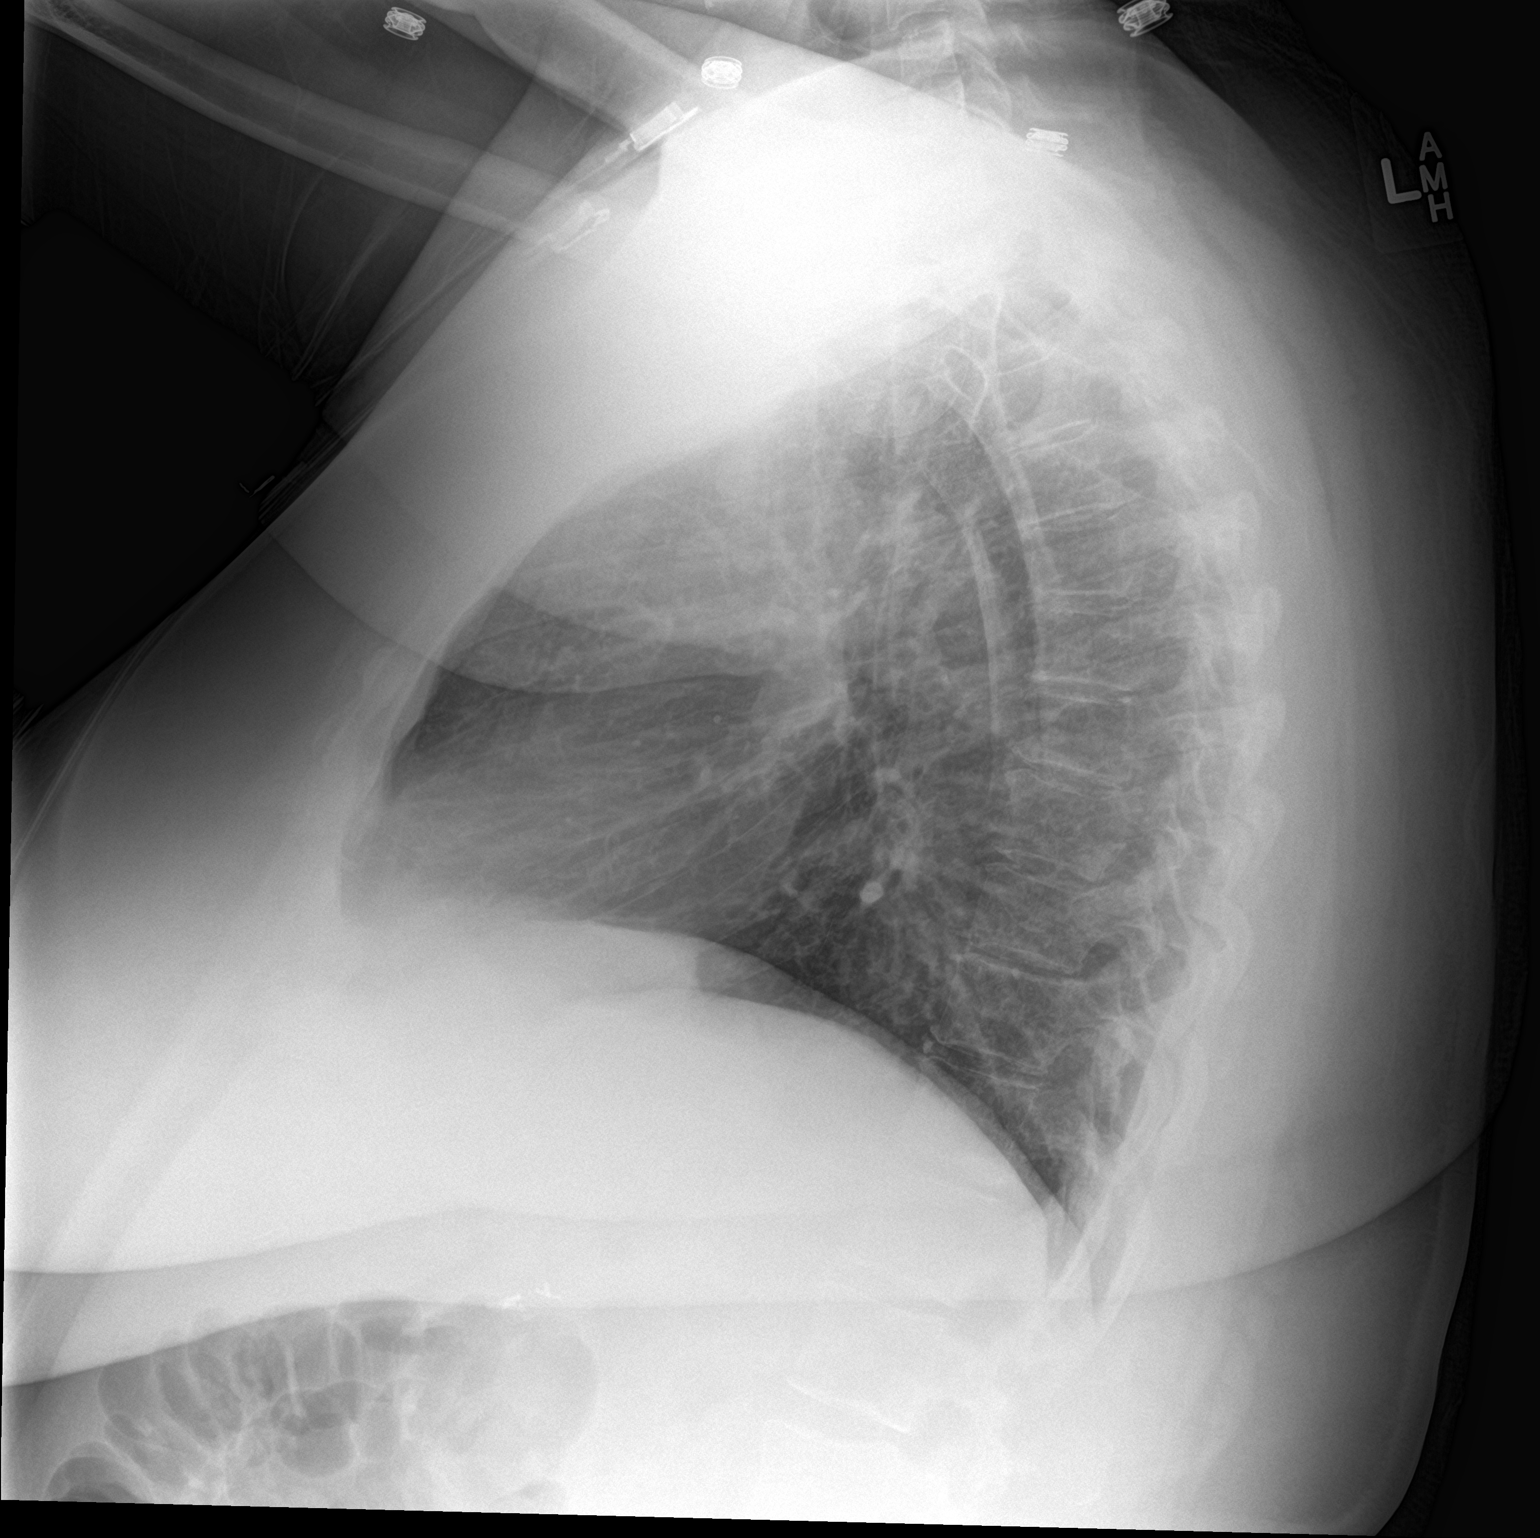

[2 of 2 positions shown; findings below may reference images not displayed]

FINDINGS: The heart size and mediastinal contours are within normal limits.
There is no evidence of pulmonary edema, consolidation,
pneumothorax, nodule or pleural fluid. The visualized skeletal
structures are unremarkable.
IMPRESSION: No active cardiopulmonary disease.

## 2017-10-25 ENCOUNTER — Ambulatory Visit (INDEPENDENT_AMBULATORY_CARE_PROVIDER_SITE_OTHER): Payer: BLUE CROSS/BLUE SHIELD | Admitting: Internal Medicine

## 2017-10-25 ENCOUNTER — Encounter (HOSPITAL_COMMUNITY): Payer: Self-pay

## 2017-10-25 ENCOUNTER — Encounter (INDEPENDENT_AMBULATORY_CARE_PROVIDER_SITE_OTHER): Payer: Self-pay | Admitting: Internal Medicine

## 2017-10-25 ENCOUNTER — Emergency Department (HOSPITAL_COMMUNITY)
Admission: EM | Admit: 2017-10-25 | Discharge: 2017-10-25 | Disposition: A | Discharge status: Home / Self Care | Payer: No Typology Code available for payment source | Attending: Emergency Medicine | Admitting: Emergency Medicine

## 2017-10-25 VITALS — BP 140/86 | HR 72 | Temp 98.4°F | Ht 61.0 in | Wt 259.9 lb

## 2017-10-25 DIAGNOSIS — R197 Diarrhea, unspecified: Secondary | ICD-10-CM

## 2017-10-25 DIAGNOSIS — R1013 Epigastric pain: Secondary | ICD-10-CM

## 2017-10-25 DIAGNOSIS — Z87891 Personal history of nicotine dependence: Secondary | ICD-10-CM | POA: Insufficient documentation

## 2017-10-25 DIAGNOSIS — E876 Hypokalemia: Secondary | ICD-10-CM | POA: Insufficient documentation

## 2017-10-25 DIAGNOSIS — Z79899 Other long term (current) drug therapy: Secondary | ICD-10-CM | POA: Diagnosis not present

## 2017-10-25 DIAGNOSIS — I10 Essential (primary) hypertension: Secondary | ICD-10-CM | POA: Insufficient documentation

## 2017-10-25 LAB — CBC
HEMATOCRIT: 37.8 % (ref 36.0–46.0)
HEMOGLOBIN: 11.5 g/dL — AB (ref 12.0–15.0)
MCH: 23.6 pg — ABNORMAL LOW (ref 26.0–34.0)
MCHC: 30.4 g/dL (ref 30.0–36.0)
MCV: 77.5 fL — AB (ref 78.0–100.0)
Platelets: 332 10*3/uL (ref 150–400)
RBC: 4.88 MIL/uL (ref 3.87–5.11)
RDW: 17.5 % — AB (ref 11.5–15.5)
WBC: 8.6 10*3/uL (ref 4.0–10.5)

## 2017-10-25 LAB — COMPREHENSIVE METABOLIC PANEL
ALBUMIN: 3.8 g/dL (ref 3.5–5.0)
ALT: 29 U/L (ref 14–54)
ANION GAP: 13 (ref 5–15)
AST: 25 U/L (ref 15–41)
Alkaline Phosphatase: 73 U/L (ref 38–126)
BUN: 10 mg/dL (ref 6–20)
CHLORIDE: 100 mmol/L — AB (ref 101–111)
CO2: 22 mmol/L (ref 22–32)
Calcium: 8.7 mg/dL — ABNORMAL LOW (ref 8.9–10.3)
Creatinine, Ser: 0.7 mg/dL (ref 0.44–1.00)
GFR calc Af Amer: >60 mL/min (ref >60)
Glucose, Bld: 111 mg/dL — ABNORMAL HIGH (ref 65–99)
POTASSIUM: 2.5 mmol/L — AB (ref 3.5–5.1)
Sodium: 135 mmol/L (ref 135–145)
Total Bilirubin: 0.5 mg/dL (ref 0.3–1.2)
Total Protein: 7.6 g/dL (ref 6.5–8.1)

## 2017-10-25 LAB — URINALYSIS, ROUTINE W REFLEX MICROSCOPIC
BILIRUBIN URINE: NEGATIVE
Glucose, UA: NEGATIVE mg/dL
KETONES UR: NEGATIVE mg/dL
Leukocytes, UA: NEGATIVE
Nitrite: NEGATIVE
PROTEIN: NEGATIVE mg/dL
Specific Gravity, Urine: 1.013 (ref 1.005–1.030)
pH: 5 (ref 5.0–8.0)

## 2017-10-25 LAB — LIPASE, BLOOD: LIPASE: 34 U/L (ref 11–51)

## 2017-10-25 MED ORDER — SUCRALFATE 1 GM/10ML PO SUSP: 1.0000 g | Freq: Four times a day (QID) | ORAL | 1 refills | Status: DC

## 2017-10-25 MED ORDER — SODIUM CHLORIDE 0.9 % IV BOLUS (SEPSIS)
500.0000 mL | Freq: Once | INTRAVENOUS | Status: AC
Start: 2017-10-25 — End: 2017-10-25
  Administered 2017-10-25: 100 mL via INTRAVENOUS

## 2017-10-25 MED ORDER — SODIUM CHLORIDE 0.9 % IV BOLUS (SEPSIS)
1000.0000 mL | Freq: Once | INTRAVENOUS | Status: AC
  Administered 2017-10-25: 1000 mL via INTRAVENOUS

## 2017-10-25 MED ORDER — GI COCKTAIL ~~LOC~~
30.0000 mL | Freq: Once | ORAL | Status: AC
  Administered 2017-10-25: 30 mL via ORAL
  Filled 2017-10-25: qty 30

## 2017-10-25 MED ORDER — FAMOTIDINE IN NACL 20-0.9 MG/50ML-% IV SOLN
20.0000 mg | Freq: Once | INTRAVENOUS | Status: AC
  Administered 2017-10-25: 20 mg via INTRAVENOUS
  Filled 2017-10-25: qty 50

## 2017-10-25 MED ORDER — POTASSIUM CHLORIDE 20 MEQ/15ML (10%) PO SOLN
ORAL | Status: AC
  Administered 2017-10-25: 40 meq
  Filled 2017-10-25: qty 30

## 2017-10-25 MED ORDER — POTASSIUM CHLORIDE 10 MEQ/100ML IV SOLN
10.0000 meq | INTRAVENOUS | Status: AC
  Administered 2017-10-25 (×3): 10 meq via INTRAVENOUS
  Filled 2017-10-25 (×3): qty 100

## 2017-10-25 MED ORDER — KETOROLAC TROMETHAMINE 30 MG/ML IJ SOLN
15.0000 mg | Freq: Once | INTRAMUSCULAR | Status: AC
  Administered 2017-10-25: 15 mg via INTRAVENOUS
  Filled 2017-10-25: qty 1

## 2017-10-25 MED ORDER — POTASSIUM CHLORIDE CRYS ER 20 MEQ PO TBCR
40.0000 meq | EXTENDED_RELEASE_TABLET | Freq: Once | ORAL | Status: DC
  Filled 2017-10-25: qty 2

## 2017-10-25 MED ORDER — POTASSIUM CHLORIDE 20 MEQ PO PACK: 40.0000 meq | PACK | Freq: Once | ORAL | Status: DC

## 2017-10-25 NOTE — Discharge Instructions (Signed)
As discussed, your evaluation today has been largely reassuring.  But, it is important that you monitor your condition carefully, and do not hesitate to return to the ED if you develop new, or concerning changes in your condition. ? ?Otherwise, please follow-up with your physician for appropriate ongoing care. ? ?

## 2017-10-25 NOTE — ED Provider Notes (Signed)
[facility] EMERGENCY DEPARTMENT Provider Note   CSN: [id number] Arrival date & time: 10/25/17  1414     History   Chief Complaint Chief Complaint  Patient presents with  . Abdominal Pain    HPI Tammy Novak is a 47 y.o. female.  HPI  Patient with a history of IBS presents with concern of weakness, abdominal pain, diarrhea. Patient notes that prior to the onset of illness she was generally well, with no recent IBS-like symptoms. However, over the past 5 days patient has had progressive weakness, as well as ongoing abdominal pain, primarily across the upper abdomen, described as sore and crampy. She has had multiple episodes of diarrhea as well.  On patient is tolerant of oral intake, but notes that she has nausea, anorexia. Patient continues to take all other medication as directed. She has history of prior cholecystectomy, appendectomy. She also has  ongoing care with her gastroenterologist. Today after speaking with her GI team, she was sent here for evaluation. She did have outpatient stool samples collected, the results are not yet available. Patient works as a Engineer, civil (consulting), has multiple sick contacts.  When she has not been on an antibiotic recently.  Past Medical History:  Diagnosis Date  . Anxiety   . Diarrhea   . GERD (gastroesophageal reflux disease)   . Hiatal hernia   . Hypertension     Patient Active Problem List   Diagnosis Date Noted  . Gastroesophageal reflux disease without esophagitis 05/03/2016  . Diarrhea 08/23/2015  . Acute appendicitis 08/25/2013    Past Surgical History:  Procedure Laterality Date  . APPENDECTOMY    . BIOPSY  05/07/2016   Procedure: BIOPSY;  Surgeon: Malissa Hippo, MD;  Location: [facility];  Service: Endoscopy;;  gastric; duodenum  . CHOLECYSTECTOMY     7-8 yrs ago for gallstones  . ESOPHAGOGASTRODUODENOSCOPY (EGD) WITH PROPOFOL N/A 05/07/2016   Procedure: ESOPHAGOGASTRODUODENOSCOPY (EGD) WITH PROPOFOL;  Surgeon: Malissa Hippo, MD;  Location: [facility];  Service: Endoscopy;  Laterality: N/A;  . LAPAROSCOPIC APPENDECTOMY N/A 08/25/2013   Procedure: APPENDECTOMY LAPAROSCOPIC;  Surgeon: Dalia Heading, MD;  Location: [facility] ORS;  Service: General;  Laterality: N/A;  . TUBAL LIGATION      OB History    No data available       Home Medications    Prior to Admission medications   Medication Sig Start Date End Date Taking? Authorizing Provider  ALPRAZolam Prudy Feeler) 0.25 MG tablet Take 0.25 mg by mouth at bedtime as needed for anxiety.   Yes [provider]  lisinopril-hydrochlorothiazide (PRINZIDE,ZESTORETIC) 20-12.5 MG per tablet Take 2 tablets by mouth daily.    Yes [provider]  metoprolol succinate (TOPROL-XL) 25 MG 24 hr tablet Take 25 mg by mouth 2 (two) times daily before a meal.   Yes [provider]  NON FORMULARY Iron 30mg    Yes [provider]  omeprazole (PRILOSEC) 40 MG capsule Take 40 mg by mouth daily.   Yes [provider]  sucralfate (CARAFATE) 1 GM/10ML suspension Take 10 mLs (1 g total) by mouth 4 (four) times daily. 10/25/17   Setzer, Brand Males, NP    Family History No family history on file.  Social History Social History   Tobacco Use  . Smoking status: Former Smoker    Packs/day: 1.00    Years: 15.00    Pack years: 15.00    Last attempt to quit: 05/03/2006    Years since quitting: 11.4  .  Smokeless tobacco: Never Used  Substance Use Topics  . Alcohol use: No  . Drug use: No     Allergies   Aspirin; Morphine and related; and Sulfa antibiotics   Review of Systems Review of Systems  Constitutional:       Per HPI, otherwise negative  HENT:       Per HPI, otherwise negative  Respiratory:       Per HPI, otherwise negative  Cardiovascular:       Per HPI, otherwise negative  Gastrointestinal: Positive for abdominal pain, diarrhea and nausea. Negative for vomiting.  Endocrine:       Negative aside from HPI  Genitourinary:        Neg aside from HPI   Musculoskeletal:       Per HPI, otherwise negative  Skin: Negative.   Neurological: Positive for weakness. Negative for syncope.     Physical Exam Updated Vital Signs BP (!) 117/51 (BP Location: Left Arm)   Pulse 63   Temp 98.1 F (36.7 C) (Oral)   Resp 18   Ht 5\' 1"  (1.549 m)   Wt 117.5 kg (259 lb)   LMP 09/25/2017   SpO2 99%   BMI 48.94 kg/m   Physical Exam  Constitutional: She is oriented to person, place, and time. She appears well-developed and well-nourished. No distress.  HENT:  Head: Normocephalic and atraumatic.  Eyes: Conjunctivae and EOM are normal.  Cardiovascular: Normal rate and regular rhythm.  Pulmonary/Chest: Effort normal and breath sounds normal. No stridor. No respiratory distress.  Abdominal: She exhibits no distension. There is tenderness in the epigastric area.  Musculoskeletal: She exhibits no edema.  Neurological: She is alert and oriented to person, place, and time. No cranial nerve deficit.  Skin: Skin is warm and dry.  Psychiatric: She has a normal mood and affect.  Nursing note and vitals reviewed.    ED Treatments / Results  Labs (all labs ordered are listed, but only abnormal results are displayed) Labs Reviewed  COMPREHENSIVE METABOLIC PANEL - Abnormal; Notable for the following components:      Result Value   Potassium 2.5 (*)    Chloride 100 (*)    Glucose, Bld 111 (*)    Calcium 8.7 (*)    All other components within normal limits  CBC - Abnormal; Notable for the following components:   Hemoglobin 11.5 (*)    MCV 77.5 (*)    MCH 23.6 (*)    RDW 17.5 (*)    All other components within normal limits  URINALYSIS, ROUTINE W REFLEX MICROSCOPIC - Abnormal; Notable for the following components:   Hgb urine dipstick SMALL (*)    Bacteria, UA RARE (*)    Squamous Epithelial / LPF 0-5 (*)    All other components within normal limits  C DIFFICILE QUICK SCREEN W PCR REFLEX  LIPASE, BLOOD  I-STAT BETA HCG  BLOOD, ED ([facility], [facility], [facility] ONLY)    EKG  EKG Interpretation None       Radiology No results found.  Procedures Procedures (including critical care time)  Medications Ordered in ED Medications  potassium chloride (KLOR-CON) packet 40 mEq (40 mEq Oral Not Given 10/25/17 1807)  sodium chloride 0.9 % bolus 1,000 mL (0 mLs Intravenous Stopped 10/25/17 1603)  gi cocktail (Maalox,Lidocaine,Donnatal) (30 mLs Oral Given 10/25/17 1454)  famotidine (PEPCID) IVPB 20 mg premix (0 mg Intravenous Stopped 10/25/17 1547)  ketorolac (TORADOL) 30 MG/ML injection 15 mg (15 mg Intravenous Given 10/25/17 1517)  potassium  chloride 10 mEq in 100 mL IVPB (0 mEq Intravenous Stopped 10/25/17 1940)  sodium chloride 0.9 % bolus 500 mL (0 mLs Intravenous Stopped 10/25/17 1904)  potassium chloride 20 MEQ/15ML (10%) solution (40 mEq  Given 10/25/17 1626)     Initial Impression / Assessment and Plan / ED Course  I have reviewed the triage vital signs and the nursing notes.  Pertinent labs & imaging results that were available during my care of the patient were reviewed by me and considered in my medical decision making (see chart for details).  Clinical Course as of Oct 25 2146  Fri Oct 25, 2017  1621 K 2.5 - IV and PO repletion ordered  [RL]    Clinical Course User Index [RL] Gerhard Munch, MD    Update patient substantially better  This patient presents with ongoing weakness, abdominal pain, diarrhea. Patient has a history of IBS, but low suspicion for acute flare given the absence of diffuse abdominal pain, and improvement here. Patient is also afebrile, generally with however, patient is found to have hypokalemia, likely secondary to her ongoing diarrhea. Here with fluid resuscitation, analgesia, patient had substantial improvement in her pain, and improvement in her color and overall appearance. Patient appropriate for discharge with close outpatient follow-up.  Final Clinical Impressions(s) / ED Diagnoses    Final diagnoses:  Epigastric pain  Hypokalemia     Gerhard Munch, MD 10/25/17 2149

## 2017-10-25 NOTE — ED Notes (Signed)
Placed on cardiac monitor 

## 2017-10-25 NOTE — Progress Notes (Addendum)
Subjective:    Patient ID: Tammy Novak, female    DOB: [birth year], 47 y.o.   MRN: [id number]  HPIPresents today with c/o diarrhea. She says she has tried to control with her diet. This past Monday she had epigastric pain and severe diarrhea. She says the diarrhea is better. The epigastric pain has eased some. She has been taking a GI cocktail that has seemed to help the pain. This morning she ate Saltine crackers this morning. She says this morning she has some pain. Since becoming sick she has 2-3 loose stools a day. No melena or BRRB. ] Under a lot of stress. Just buried her granchild (suspcious circumstances).  No recent hx of antiboitics She underwent an EGD  (Epigastric abdominal pain) in 2017 which revealed  Impression:               - Normal esophagus.                           - Z-line irregular, 31 cm from the incisors.                           - 4 cm hiatal hernia.                           - Gastritis. Biopsied.                           - Few small gastric polyps at gastric body and                            fundus with features of hyperplastic polyps.    These were left alone.     Review of Systems    Past Medical History:  Diagnosis Date  . Anxiety   . Diarrhea   . GERD (gastroesophageal reflux disease)   . Hiatal hernia   . Hypertension     Past Surgical History:  Procedure Laterality Date  . APPENDECTOMY    . BIOPSY  05/07/2016   Procedure: BIOPSY;  Surgeon: Malissa HippoNajeeb U Rehman, MD;  Location: [facility];  Service: Endoscopy;;  gastric; duodenum  . CHOLECYSTECTOMY     7-8 yrs ago for gallstones  . ESOPHAGOGASTRODUODENOSCOPY (EGD) WITH PROPOFOL N/A 05/07/2016   Procedure: ESOPHAGOGASTRODUODENOSCOPY (EGD) WITH PROPOFOL;  Surgeon: Malissa HippoNajeeb U Rehman, MD;  Location: [facility];  Service: Endoscopy;  Laterality: N/A;  . LAPAROSCOPIC APPENDECTOMY N/A 08/25/2013   Procedure: APPENDECTOMY LAPAROSCOPIC;  Surgeon: Dalia HeadingMark A Jenkins, MD;  Location: [facility] OR[facility];  Service:  General;  Laterality: N/A;  . TUBAL LIGATION      Allergies  Allergen Reactions  . Aspirin   . Morphine And Related   . Sulfa Antibiotics     Current Outpatient Medications on File Prior to Visit  Medication Sig Dispense Refill  . ALPRAZolam (XANAX) 0.25 MG tablet Take 0.25 mg by mouth at bedtime as needed for anxiety.    Marland Kitchen. lisinopril-hydrochlorothiazide (PRINZIDE,ZESTORETIC) 20-12.5 MG per tablet Take 1 tablet by mouth daily.     . metoprolol succinate (TOPROL-XL) 25 MG 24 hr tablet Take 25 mg by mouth 2 (two) times daily before a meal.    . NON FORMULARY Iron 30mg     . omeprazole (PRILOSEC) 40 MG capsule Take 40 mg by mouth daily.  No current facility-administered medications on file prior to visit.      Objective:   Physical Exam Blood pressure 140/86, pulse 72, temperature 98.4 F (36.9 C), height 5\' 1"  (1.549 m), weight 259 lb 14.4 oz (117.9 kg). Alert and oriented. Skin warm and dry. Oral mucosa is moist.   . Sclera anicteric, conjunctivae is pink. Thyroid not enlarged. No cervical lymphadenopathy. Lungs clear. Heart regular rate and rhythm.  Abdomen is soft. Bowel sounds are positive. No hepatomegaly. No abdominal masses felt. No tenderness.  No edema to lower extremities.         Assessment & Plan:  Epigastric pain: Will try Carafate qid and see how she does. Diarrhea: GI pathogen.

## 2017-10-25 NOTE — ED Notes (Signed)
Date and time results received: 10/25/17 1535 (use smartphrase ".now" to insert current time)  Test: potassium Critical Value: 2.5  Name of Provider Notified: Dr. Jeraldine LootsLockwood notified @ 1536  Orders Received? Or Actions Taken?: no/na

## 2017-10-25 NOTE — Patient Instructions (Signed)
GI pathogen. Rx for Carafate

## 2017-10-25 NOTE — ED Triage Notes (Signed)
Pt reports has had abd pain and diarrhea x 5 days.  Reports has IBS and has been under a lot of stress recently.  Pt saw GI this morning and was told symptoms were probably related to IBS but requested stool samples.  Pt says feels weak and dehydrated.  Also has prescription for carafate.

## 2017-10-28 LAB — GASTROINTESTINAL PATHOGEN PANEL PCR
C. difficile Tox A/B, PCR: NOT DETECTED
CRYPTOSPORIDIUM, PCR: NOT DETECTED
Campylobacter, PCR: NOT DETECTED
E COLI (ETEC) LT/ST, PCR: NOT DETECTED
E COLI (STEC) STX1/STX2, PCR: NOT DETECTED
E coli 0157, PCR: NOT DETECTED
Giardia lamblia, PCR: NOT DETECTED
NOROVIRUS, PCR: NOT DETECTED
ROTAVIRUS, PCR: NOT DETECTED
SHIGELLA, PCR: NOT DETECTED
Salmonella, PCR: NOT DETECTED

## 2018-01-04 ENCOUNTER — Telehealth: Payer: PRIVATE HEALTH INSURANCE | Admitting: Family Medicine

## 2018-01-04 DIAGNOSIS — R05 Cough: Secondary | ICD-10-CM

## 2018-01-04 DIAGNOSIS — R0982 Postnasal drip: Secondary | ICD-10-CM

## 2018-01-04 DIAGNOSIS — J019 Acute sinusitis, unspecified: Secondary | ICD-10-CM

## 2018-01-04 DIAGNOSIS — R059 Cough, unspecified: Secondary | ICD-10-CM

## 2018-01-04 MED ORDER — ALBUTEROL SULFATE HFA 108 (90 BASE) MCG/ACT IN AERS: 2.0000 | INHALATION_SPRAY | Freq: Four times a day (QID) | RESPIRATORY_TRACT | 1 refills | Status: AC | PRN

## 2018-01-04 MED ORDER — PSEUDOEPH-BROMPHEN-DM 30-2-10 MG/5ML PO SYRP: 5.0000 mL | ORAL_SOLUTION | Freq: Four times a day (QID) | ORAL | 0 refills | Status: DC | PRN

## 2018-01-04 MED ORDER — FLUCONAZOLE 150 MG PO TABS: 150.0000 mg | ORAL_TABLET | Freq: Once | ORAL | 0 refills | Status: AC

## 2018-01-04 MED ORDER — AMOXICILLIN-POT CLAVULANATE 875-125 MG PO TABS: 1.0000 | ORAL_TABLET | Freq: Two times a day (BID) | ORAL | 0 refills | Status: DC

## 2018-01-04 NOTE — Progress Notes (Signed)
We are sorry that you are not feeling well.  Here is how we plan to help!  Based on what you have shared with me it looks like you have sinusitis.  Sinusitis is inflammation and infection in the sinus cavities of the head.  Based on your presentation I believe you most likely have Acute Bacterial Sinusitis.  This is an infection caused by bacteria and is treated with antibiotics. I have prescribed Augmentin 875mg /125mg  one tablet twice daily with food, for 7 days. Augmentin may often cause symptoms of vaginitis therefore I will prescribe a Diflucan tablet 150 mg once if vaginal irritation develops. For cough, I have prescribed Bromfed cough syrup, take 5 mls every 6 hours as needed for cough. Continue your current rescue inhaler as needed for shortness of breath and or wheezing.  You may use an oral decongestant such as Mucinex D or if you have glaucoma or high blood pressure use plain Mucinex. Saline nasal spray help and can safely be used as often as needed for congestion.  If you develop worsening sinus pain, fever or notice severe headache and vision changes, or if symptoms are not better after completion of antibiotic, please schedule an appointment with a health care provider.    Sinus infections are not as easily transmitted as other respiratory infection, however we still recommend that you avoid close contact with loved ones, especially the very young and elderly.  Remember to wash your hands thoroughly throughout the day as this is the number one way to prevent the spread of infection!  Home Care:  Only take medications as instructed by your medical team.  Complete the entire course of an antibiotic.  Do not take these medications with alcohol.  A steam or ultrasonic humidifier can help congestion.  You can place a towel over your head and breathe in the steam from hot water coming from a faucet.  Avoid close contacts especially the very young and the elderly.  Cover your mouth when you  cough or sneeze.  Always remember to wash your hands.  Get Help Right Away If:  You develop worsening fever or sinus pain.  You develop a severe head ache or visual changes.  Your symptoms persist after you have completed your treatment plan.  Make sure you  Understand these instructions.  Will watch your condition.  Will get help right away if you are not doing well or get worse.  Your e-visit answers were reviewed by a board certified advanced clinical practitioner to complete your personal care plan.  Depending on the condition, your plan could have included both over the counter or prescription medications.  If there is a problem please reply  once you have received a response from your provider.  Your safety is important to us.  If you have drug allergies check your prescription carefully.    You can use MyChart to ask questions about today's visit, request a non-urgent call back, or ask for a work or school excuse for 24 hours related to this e-Visit. If it has been greater than 24 hours you will need to follow up with your provider, or enter a new e-Visit to address those concerns.  You will get an e-mail in the next two days asking about your experience.  I hope that your e-visit has been valuable and will speed your recovery. Thank you for using e-visits.

## 2018-01-04 NOTE — Addendum Note (Signed)
Addended by: Bing NeighborsHARRIS, Angellina Ferdinand S on: 01/04/2018 10:10 AM   Modules accepted: Orders

## 2018-09-11 ENCOUNTER — Other Ambulatory Visit (INDEPENDENT_AMBULATORY_CARE_PROVIDER_SITE_OTHER): Payer: Self-pay | Admitting: Internal Medicine

## 2018-09-11 ENCOUNTER — Encounter (INDEPENDENT_AMBULATORY_CARE_PROVIDER_SITE_OTHER): Payer: Self-pay | Admitting: *Deleted

## 2018-09-11 ENCOUNTER — Ambulatory Visit (INDEPENDENT_AMBULATORY_CARE_PROVIDER_SITE_OTHER): Payer: BLUE CROSS/BLUE SHIELD | Admitting: Internal Medicine

## 2018-09-11 ENCOUNTER — Encounter (INDEPENDENT_AMBULATORY_CARE_PROVIDER_SITE_OTHER): Payer: Self-pay | Admitting: Internal Medicine

## 2018-09-11 VITALS — BP 142/78 | HR 72 | Temp 98.3°F | Ht 61.0 in | Wt 274.0 lb

## 2018-09-11 DIAGNOSIS — R195 Other fecal abnormalities: Secondary | ICD-10-CM

## 2018-09-11 DIAGNOSIS — K219 Gastro-esophageal reflux disease without esophagitis: Secondary | ICD-10-CM

## 2018-09-11 NOTE — Patient Instructions (Signed)
The risks of bleeding, perforation and infection were reviewed with patient.  

## 2018-09-11 NOTE — Progress Notes (Signed)
Subjective:    Patient ID: Tammy Novak, female    DOB: Dec 24, 1970, 47 y.o.   MRN: [id number]  HPI Last seen in February with c/o diarrhea.  Presents today with c/o chronic diarrhea. She says she has constipation and now she has a hemorrhoid.  She has had some severe pain in her upper abdomen.  She says she used a GI cocktail which helped.  She says she has epigastric pain.  The Omeprazole is helping.  She did have a flare of diarrhea last week.  She says she has been constipated all summer.  She has pressure in her rectum. She uses Preparation H for this.  She to a Ibuprofen last week and had some epigastric pain.  She has never had a colonoscopy No family hx of colonoscopy. She also would like to proceed with an EGD. Has been under a lot of stress. Death of a grandchild. Afraid she may have developed an ulcer.  EGD in August 2017 Impression:               - Normal esophagus.                           - Z-line irregular, 31 cm from the incisors.                           - 4 cm hiatal hernia.                           - Gastritis. Biopsied.                           - Few small gastric polyps at gastric body and                            fundus with features of hyperplastic polyps. These                            were left alone.                           - Normal duodenal bulb and second portion of the                            duodenum. Biopsied which was normal.     Review of Systems Past Medical History:  Diagnosis Date  . Anxiety   . Diarrhea   . GERD (gastroesophageal reflux disease)   . Hiatal hernia   . Hypertension     Past Surgical History:  Procedure Laterality Date  . APPENDECTOMY    . BIOPSY  05/07/2016   Procedure: BIOPSY;  Surgeon: Malissa HippoNajeeb U Rehman, MD;  Location: [facility];  Service: Endoscopy;;  gastric; duodenum  . CHOLECYSTECTOMY     7-8 yrs ago for gallstones  . ESOPHAGOGASTRODUODENOSCOPY (EGD) WITH PROPOFOL N/A 05/07/2016   Procedure:  ESOPHAGOGASTRODUODENOSCOPY (EGD) WITH PROPOFOL;  Surgeon: Malissa HippoNajeeb U Rehman, MD;  Location: [facility];  Service: Endoscopy;  Laterality: N/A;  . LAPAROSCOPIC APPENDECTOMY N/A 08/25/2013   Procedure: APPENDECTOMY LAPAROSCOPIC;  Surgeon: Dalia HeadingMark A Jenkins, MD;  Location: [facility];  Service: General;  Laterality: N/A;  . TUBAL  LIGATION      Allergies  Allergen Reactions  . Aspirin     Causes acid reflux.   . Morphine And Related     Drops blood pressure.   . Sulfa Antibiotics     Current Outpatient Medications on File Prior to Visit  Medication Sig Dispense Refill  . albuterol (PROVENTIL HFA;VENTOLIN HFA) 108 (90 Base) MCG/ACT inhaler Inhale 2 puffs into the lungs every 6 (six) hours as needed for wheezing or shortness of breath (cough, shortness of breath or wheezing.). 1 Inhaler 1  . ALPRAZolam (XANAX) 0.25 MG tablet Take 0.25 mg by mouth at bedtime as needed for anxiety.    Marland Kitchen. lisinopril-hydrochlorothiazide (PRINZIDE,ZESTORETIC) 20-12.5 MG per tablet Take 2 tablets by mouth daily.     . metoprolol succinate (TOPROL-XL) 25 MG 24 hr tablet Take 25 mg by mouth 2 (two) times daily before a meal.    . NON FORMULARY Iron 30mg     . omeprazole (PRILOSEC) 40 MG capsule Take 40 mg by mouth daily.     No current facility-administered medications on file prior to visit.         Objective:   Physical Exam Blood pressure (!) 142/78, pulse 72, temperature 98.3 F (36.8 C), height 5\' 1"  (1.549 m), weight 274 lb (124.3 kg). Alert and oriented. Skin warm and dry. Oral mucosa is moist.   . Sclera anicteric, conjunctivae is pink. Thyroid not enlarged. No cervical lymphadenopathy. Lungs clear. Heart regular rate and rhythm.  Abdomen is soft. Bowel sounds are positive. No hepatomegaly. No abdominal masses felt. No tenderness.  No edema to lower extremities. .         Assessment & Plan:  Change in Stool. Colonic neoplasm needs to be ruled out. Colonoscopy. GERD: Worsening symptoms. Needs an EGD. Also  at patient's request.

## 2018-09-26 NOTE — Patient Instructions (Signed)
Tammy Novak  09/26/2018     @PREFPERIOPPHARMACY @   Your procedure is scheduled on  10/10/2018.  [facility] at   [facility] A.[facility].  Call this number if you have problems the morning of surgery:  [phone]   Remember:  Follow the diet and prep instructions given to you by Dr Patty Sermonsehman's office.                       Take these medicines the morning of surgery with A SIP OF WATER  Lisinopril, metoprolol, omeprazole. Use your inhaler before you come.    Do not wear jewelry, make-up or nail polish.  Do not wear lotions, powders, or perfumes, or deodorant.  Do not shave 48 hours prior to surgery.  Men may shave face and neck.  Do not bring valuables to the hospital.  [facility] is not responsible for any belongings or valuables.  Contacts, dentures or bridgework may not be worn into surgery.  Leave your suitcase in the car.  After surgery it may be brought to your room.  For patients admitted to the hospital, discharge time will be determined by your treatment team.  Patients discharged the day of surgery will not be allowed to drive home.   Name and phone number of your driver:   family Special instructions:  None  Please read over the following fact sheets that you were given. Anesthesia Post-op Instructions and Care and Recovery After Surgery       Upper Endoscopy, Adult Upper endoscopy is a procedure to look inside the upper GI (gastrointestinal) tract. The upper GI tract is made up of:  The part of the body that moves food from your mouth to your stomach (esophagus).  The stomach.  The first part of your small intestine (duodenum). This procedure is also called esophagogastroduodenoscopy (EGD) or gastroscopy. In this procedure, your health care provider passes a thin, flexible tube (endoscope) through your mouth and down your esophagus into your stomach. A small camera is attached to the end of the tube. Images from the camera appear on a  monitor in the exam room. During this procedure, your health care provider may also remove a small piece of tissue to be sent to a lab and examined under a microscope (biopsy). Your health care provider may do an upper endoscopy to diagnose cancers of the upper GI tract. You may also have this procedure to find the cause of other conditions, such as:  Stomach pain.  Heartburn.  Pain or problems when swallowing.  Nausea and vomiting.  Stomach bleeding.  Stomach ulcers. Tell a health care provider about:  Any allergies you have.  All medicines you are taking, including vitamins, herbs, eye drops, creams, and over-the-counter medicines.  Any problems you or family members have had with anesthetic medicines.  Any blood disorders you have.  Any surgeries you have had.  Any medical conditions you have.  Whether you are pregnant or may be pregnant. What are the risks? Generally, this is a safe procedure. However, problems may occur, including:  Infection.  Bleeding.  Allergic reactions to medicines.  A tear or hole (perforation) in the esophagus, stomach, or duodenum. What happens before the procedure? Staying hydrated Follow instructions from your health care provider about hydration, which may include:  Up to 2 hours before the procedure - you may continue to drink clear liquids, such as  water, clear fruit juice, black coffee, and plain tea.  Eating and drinking restrictions Follow instructions from your health care provider about eating and drinking, which may include:  8 hours before the procedure - stop eating heavy meals or foods, such as meat, fried foods, or fatty foods.  6 hours before the procedure - stop eating light meals or foods, such as toast or cereal.  6 hours before the procedure - stop drinking milk or drinks that contain milk.  2 hours before the procedure - stop drinking clear liquids. Medicines Ask your health care provider about:  Changing or  stopping your regular medicines. This is especially important if you are taking diabetes medicines or blood thinners.  Taking medicines such as aspirin and ibuprofen. These medicines can thin your blood. Do not take these medicines unless your health care provider tells you to take them.  Taking over-the-counter medicines, vitamins, herbs, and supplements. General instructions  Plan to have someone take you home from the hospital or clinic.  If you will be going home right after the procedure, plan to have someone with you for 24 hours.  Ask your health care provider what steps will be taken to help prevent infection. What happens during the procedure?   An IV will be inserted into one of your veins.  You may be given one or more of the following: ? A medicine to help you relax (sedative). ? A medicine to numb the throat (local anesthetic).  You will lie on your left side on an exam table.  Your health care provider will pass the endoscope through your mouth and down your esophagus.  Your health care provider will use the scope to check the inside of your esophagus, stomach, and duodenum. Biopsies may be taken.  The endoscope will be removed. The procedure may vary among health care providers and hospitals. What happens after the procedure?  Your blood pressure, heart rate, breathing rate, and blood oxygen level will be monitored until you leave the hospital or clinic.  Do not drive for 24 hours if you were given a sedative during your procedure.  When your throat is no longer numb, you may be given some fluids to drink.  It is up to you to get the results of your procedure. Ask your health care provider, or the department that is doing the procedure, when your results will be ready. Summary  Upper endoscopy is a procedure to look inside the upper GI tract.  During the procedure, an IV will be inserted into one of your veins. You may be given a medicine to help you  relax.  A medicine will be used to numb your throat.  The endoscope will be passed through your mouth and down your esophagus. This information is not intended to replace advice given to you by your health care provider. Make sure you discuss any questions you have with your health care provider. Document Released: 09/07/2000 Document Revised: 02/10/2018 Document Reviewed: 02/10/2018 Elsevier Interactive Patient Education  2019 [employer]  Upper Endoscopy, Adult, Care After This sheet gives you information about how to care for yourself after your procedure. Your health care provider may also give you more specific instructions. If you have problems or questions, contact your health care provider. What can I expect after the procedure? After the procedure, it is common to have:  A sore throat.  Mild stomach pain or discomfort.  Bloating.  Nausea. Follow these instructions at home:   Follow instructions from  your health care provider about what to eat or drink after your procedure.  Return to your normal activities as told by your health care provider. Ask your health care provider what activities are safe for you.  Take over-the-counter and prescription medicines only as told by your health care provider.  Do not drive for 24 hours if you were given a sedative during your procedure.  Keep all follow-up visits as told by your health care provider. This is important. Contact a health care provider if you have:  A sore throat that lasts longer than one day.  Trouble swallowing. Get help right away if:  You vomit blood or your vomit looks like coffee grounds.  You have: ? A fever. ? Bloody, black, or tarry stools. ? A severe sore throat or you cannot swallow. ? Difficulty breathing. ? Severe pain in your chest or abdomen. Summary  After the procedure, it is common to have a sore throat, mild stomach discomfort, bloating, and nausea.  Do not drive for 24 hours if  you were given a sedative during the procedure.  Follow instructions from your health care provider about what to eat or drink after your procedure.  Return to your normal activities as told by your health care provider. This information is not intended to replace advice given to you by your health care provider. Make sure you discuss any questions you have with your health care provider. Document Released: 03/11/2012 Document Revised: 02/10/2018 Document Reviewed: 02/10/2018 Elsevier Interactive Patient Education  2019 [employer]  Colonoscopy, Adult A colonoscopy is an exam to look at the large intestine. It is done to check for problems, such as:  Lumps (tumors).  Growths (polyps).  Swelling (inflammation).  Bleeding. What happens before the procedure? Eating and drinking Follow instructions from your doctor about eating and drinking. These instructions may include:  A few days before the procedure - follow a low-fiber diet. ? Avoid nuts. ? Avoid seeds. ? Avoid dried fruit. ? Avoid raw fruits. ? Avoid vegetables.  1-3 days before the procedure - follow a clear liquid diet. Avoid liquids that have red or purple dye. Drink only clear liquids, such as: ? Clear broth or bouillon. ? Black coffee or tea. ? Clear juice. ? Clear soft drinks or sports drinks. ? Gelatin dessert. ? Popsicles.  On the day of the procedure - do not eat or drink anything during the 2 hours before the procedure. Up to 2 hours before the procedure, you may continue to drink clear liquids, such as water or clear fruit juice.  Bowel prep If you were prescribed an oral bowel prep:  Take it as told by your doctor. Starting the day before your procedure, you will need to drink a lot of liquid. The liquid will cause you to poop (have bowel movements) until your poop is almost clear or light green.  To clean out your colon, you may also be given: ? Laxative medicines. ? Instructions about how to use an  enema.  If your skin or butt gets irritated from diarrhea, you may: ? Wipe the area with wipes that have medicine in them, such as adult wet wipes with aloe and vitamin E. ? Put something on your skin that soothes the area, such as petroleum jelly.  If you throw up (vomit) while drinking the bowel prep, take a break for up to 60 minutes. Then begin the bowel prep again. If you keep throwing up and you cannot take the bowel prep  without throwing up, call your doctor. General instructions  Ask your doctor about: ? Changing or stopping your normal medicines. This is important if you take iron pills, diabetes medicines, or blood thinners. ? Taking medicines such as aspirin and ibuprofen. These medicines can thin your blood. Do not take these medicines unless your doctor tells you to take them.  Plan to have someone take you home from the hospital or clinic. What happens during the procedure?   An IV tube may be put into one of your veins.  You will be given medicine to help you relax (sedative).  To reduce your risk of infection: ? Your doctors will wash their hands. ? Your anal area will be washed with soap.  You will be asked to lie on your side with your knees bent.  Your doctor will get a long, thin, flexible tube ready. The tube will have a camera and a light on the end.  The tube will be put into your anus.  The tube will be gently put into your large intestine.  Air will be delivered into your large intestine to keep it open. You may feel some pressure or cramping.  The camera will be used to take photos.  A small tissue sample may be removed for testing (biopsy).  If small growths are found, your doctor may remove them and have them checked for cancer.  The tube that was put into your anus will be slowly removed. The procedure may vary among doctors and hospitals. What happens after the procedure?  Your doctor will check on you often until the medicines you were given  have worn off.  Do not drive for 24 hours after the procedure.  You may have a small amount of blood in your poop.  You may pass gas.  You may have mild cramps or bloating in your belly (abdomen).  It is up to you to get the results of your procedure. Ask your doctor, or the department performing the procedure, when your results will be ready. Summary  A colonoscopy is an exam to look at the large intestine.  Follow instructions from your doctor about eating and drinking before the procedure.  If you were prescribed an oral bowel prep to clean out your colon, take it as told by your doctor.  Your doctor will check on you often until the medicines you were given have worn off.  Plan to have someone take you home from the hospital or clinic. This information is not intended to replace advice given to you by your health care provider. Make sure you discuss any questions you have with your health care provider. Document Released: 10/13/2010 Document Revised: 07/10/2017 Document Reviewed: 11/22/2015 Elsevier Interactive Patient Education  2019 [employer] Inc.  Colonoscopy, Adult, Care After This sheet gives you information about how to care for yourself after your procedure. Your health care provider may also give you more specific instructions. If you have problems or questions, contact your health care provider. What can I expect after the procedure? After the procedure, it is common to have:  A small amount of blood in your stool for 24 hours after the procedure.  Some gas.  Mild abdominal cramping or bloating. Follow these instructions at home: General instructions  For the first 24 hours after the procedure: ? Do not drive or use machinery. ? Do not sign important documents. ? Do not drink alcohol. ? Do your regular daily activities at a slower pace than normal. ?  Eat soft, easy-to-digest foods.  Take over-the-counter or prescription medicines only as told by your health  care provider. Relieving cramping and bloating   Try walking around when you have cramps or feel bloated.  Apply heat to your abdomen as told by your health care provider. Use a heat source that your health care provider recommends, such as a moist heat pack or a heating pad. ? Place a towel between your skin and the heat source. ? Leave the heat on for 20-30 minutes. ? Remove the heat if your skin turns bright red. This is especially important if you are unable to feel pain, heat, or cold. You may have a greater risk of getting burned. Eating and drinking   Drink enough fluid to keep your urine pale yellow.  Resume your normal diet as instructed by your health care provider. Avoid heavy or fried foods that are hard to digest.  Avoid drinking alcohol for as long as instructed by your health care provider. Contact a health care provider if:  You have blood in your stool 2-3 days after the procedure. Get help right away if:  You have more than a small spotting of blood in your stool.  You pass large blood clots in your stool.  Your abdomen is swollen.  You have nausea or vomiting.  You have a fever.  You have increasing abdominal pain that is not relieved with medicine. Summary  After the procedure, it is common to have a small amount of blood in your stool. You may also have mild abdominal cramping and bloating.  For the first 24 hours after the procedure, do not drive or use machinery, sign important documents, or drink alcohol.  Contact your health care provider if you have a lot of blood in your stool, nausea or vomiting, a fever, or increased abdominal pain. This information is not intended to replace advice given to you by your health care provider. Make sure you discuss any questions you have with your health care provider. Document Released: 04/24/2004 Document Revised: 07/03/2017 Document Reviewed: 11/22/2015 Elsevier Interactive Patient Education  2019 [employer]  Inc.  Monitored Anesthesia Care Anesthesia is a term that refers to techniques, procedures, and medicines that help a person stay safe and comfortable during a medical procedure. Monitored anesthesia care, or sedation, is one type of anesthesia. Your anesthesia specialist may recommend sedation if you will be having a procedure that does not require you to be unconscious, such as:  Cataract surgery.  A dental procedure.  A biopsy.  A colonoscopy. During the procedure, you may receive a medicine to help you relax (sedative). There are three levels of sedation:  Mild sedation. At this level, you may feel awake and relaxed. You will be able to follow directions.  Moderate sedation. At this level, you will be sleepy. You may not remember the procedure.  Deep sedation. At this level, you will be asleep. You will not remember the procedure. The more medicine you are given, the deeper your level of sedation will be. Depending on how you respond to the procedure, the anesthesia specialist may change your level of sedation or the type of anesthesia to fit your needs. An anesthesia specialist will monitor you closely during the procedure. Let your health care provider know about:  Any allergies you have.  All medicines you are taking, including vitamins, herbs, eye drops, creams, and over-the-counter medicines.  Any use of steroids (by mouth or as a cream).  Any problems you or family  members have had with sedatives and anesthetic medicines.  Any blood disorders you have.  Any surgeries you have had.  Any medical conditions you have, such as sleep apnea.  Whether you are pregnant or may be pregnant.  Any use of cigarettes, alcohol, or street drugs. What are the risks? Generally, this is a safe procedure. However, problems may occur, including:  Getting too much medicine (oversedation).  Nausea.  Allergic reaction to medicines.  Trouble breathing. If this happens, a breathing  tube may be used to help with breathing. It will be removed when you are awake and breathing on your own.  Heart trouble.  Lung trouble. Before the procedure Staying hydrated Follow instructions from your health care provider about hydration, which may include:  Up to 2 hours before the procedure - you may continue to drink clear liquids, such as water, clear fruit juice, black coffee, and plain tea. Eating and drinking restrictions Follow instructions from your health care provider about eating and drinking, which may include:  8 hours before the procedure - stop eating heavy meals or foods such as meat, fried foods, or fatty foods.  6 hours before the procedure - stop eating light meals or foods, such as toast or cereal.  6 hours before the procedure - stop drinking milk or drinks that contain milk.  2 hours before the procedure - stop drinking clear liquids. Medicines Ask your health care provider about:  Changing or stopping your regular medicines. This is especially important if you are taking diabetes medicines or blood thinners.  Taking medicines such as aspirin and ibuprofen. These medicines can thin your blood. Do not take these medicines before your procedure if your health care provider instructs you not to. Tests and exams  You will have a physical exam.  You may have blood tests done to show: ? How well your kidneys and liver are working. ? How well your blood can clot. General instructions  Plan to have someone take you home from the hospital or clinic.  If you will be going home right after the procedure, plan to have someone with you for 24 hours.  What happens during the procedure?  Your blood pressure, heart rate, breathing, level of pain and overall condition will be monitored.  An IV tube will be inserted into one of your veins.  Your anesthesia specialist will give you medicines as needed to keep you comfortable during the procedure. This may mean  changing the level of sedation.  The procedure will be performed. After the procedure  Your blood pressure, heart rate, breathing rate, and blood oxygen level will be monitored until the medicines you were given have worn off.  Do not drive for 24 hours if you received a sedative.  You may: ? Feel sleepy, clumsy, or nauseous. ? Feel forgetful about what happened after the procedure. ? Have a sore throat if you had a breathing tube during the procedure. ? Vomit. This information is not intended to replace advice given to you by your health care provider. Make sure you discuss any questions you have with your health care provider. Document Released: 06/06/2005 Document Revised: 02/17/2016 Document Reviewed: 01/01/2016 Elsevier Interactive Patient Education  2019 [place], Care After These instructions provide you with information about caring for yourself after your procedure. Your health care provider may also give you more specific instructions. Your treatment has been planned according to current medical practices, but problems sometimes occur. Call your health care provider  if you have any problems or questions after your procedure. What can I expect after the procedure? After your procedure, you may:  Feel sleepy for several hours.  Feel clumsy and have poor balance for several hours.  Feel forgetful about what happened after the procedure.  Have poor judgment for several hours.  Feel nauseous or vomit.  Have a sore throat if you had a breathing tube during the procedure. Follow these instructions at home: For at least 24 hours after the procedure:      Have a responsible adult stay with you. It is important to have someone help care for you until you are awake and alert.  Rest as needed.  Do not: ? Participate in activities in which you could fall or become injured. ? Drive. ? Use heavy machinery. ? Drink alcohol. ? Take sleeping  pills or medicines that cause drowsiness. ? Make important decisions or sign legal documents. ? Take care of children on your own. Eating and drinking  Follow the diet that is recommended by your health care provider.  If you vomit, drink water, juice, or soup when you can drink without vomiting.  Make sure you have little or no nausea before eating solid foods. General instructions  Take over-the-counter and prescription medicines only as told by your health care provider.  If you have sleep apnea, surgery and certain medicines can increase your risk for breathing problems. Follow instructions from your health care provider about wearing your sleep device: ? Anytime you are sleeping, including during daytime naps. ? While taking prescription pain medicines, sleeping medicines, or medicines that make you drowsy.  If you smoke, do not smoke without supervision.  Keep all follow-up visits as told by your health care provider. This is important. Contact a health care provider if:  You keep feeling nauseous or you keep vomiting.  You feel light-headed.  You develop a rash.  You have a fever. Get help right away if:  You have trouble breathing. Summary  For several hours after your procedure, you may feel sleepy and have poor judgment.  Have a responsible adult stay with you for at least 24 hours or until you are awake and alert. This information is not intended to replace advice given to you by your health care provider. Make sure you discuss any questions you have with your health care provider. Document Released: 01/01/2016 Document Revised: 04/26/2017 Document Reviewed: 01/01/2016 Elsevier Interactive Patient Education  2019 [employer].

## 2018-10-03 ENCOUNTER — Encounter (HOSPITAL_COMMUNITY)
Admission: RE | Admit: 2018-10-03 | Discharge: 2018-10-03 | Disposition: A | Discharge status: Home / Self Care | Payer: BLUE CROSS/BLUE SHIELD | Source: Ambulatory Visit | Attending: Internal Medicine | Admitting: Internal Medicine

## 2018-10-03 ENCOUNTER — Encounter (HOSPITAL_COMMUNITY): Payer: Self-pay

## 2018-10-03 ENCOUNTER — Other Ambulatory Visit: Payer: Self-pay

## 2018-10-03 DIAGNOSIS — Z01818 Encounter for other preprocedural examination: Secondary | ICD-10-CM | POA: Insufficient documentation

## 2018-10-03 DIAGNOSIS — K219 Gastro-esophageal reflux disease without esophagitis: Secondary | ICD-10-CM

## 2018-10-03 DIAGNOSIS — R195 Other fecal abnormalities: Secondary | ICD-10-CM

## 2018-10-03 HISTORY — DX: Anemia, unspecified: D64.9

## 2018-10-03 HISTORY — DX: Personal history of urinary calculi: Z87.442

## 2018-10-03 HISTORY — DX: Palpitations: R00.2

## 2018-10-03 LAB — BASIC METABOLIC PANEL
Anion gap: 8 (ref 5–15)
BUN: 15 mg/dL (ref 6–20)
CALCIUM: 9 mg/dL (ref 8.9–10.3)
CO2: 22 mmol/L (ref 22–32)
CREATININE: 0.62 mg/dL (ref 0.44–1.00)
Chloride: 108 mmol/L (ref 98–111)
GFR calc Af Amer: >60 mL/min (ref >60)
GFR calc non Af Amer: >60 mL/min (ref >60)
Glucose, Bld: 146 mg/dL — ABNORMAL HIGH (ref 70–99)
Potassium: 3.8 mmol/L (ref 3.5–5.1)
Sodium: 138 mmol/L (ref 135–145)

## 2018-10-03 LAB — CBC WITH DIFFERENTIAL/PLATELET
Abs Immature Granulocytes: 0.03 10*3/uL (ref 0.00–0.07)
BASOS ABS: 0 10*3/uL (ref 0.0–0.1)
Basophils Relative: 0 %
Eosinophils Absolute: 0.2 10*3/uL (ref 0.0–0.5)
Eosinophils Relative: 2 %
HCT: 35.8 % — ABNORMAL LOW (ref 36.0–46.0)
Hemoglobin: 10.5 g/dL — ABNORMAL LOW (ref 12.0–15.0)
Immature Granulocytes: 0 %
Lymphocytes Relative: 37 %
Lymphs Abs: 3.9 10*3/uL (ref 0.7–4.0)
MCH: 23 pg — ABNORMAL LOW (ref 26.0–34.0)
MCHC: 29.3 g/dL — ABNORMAL LOW (ref 30.0–36.0)
MCV: 78.3 fL — ABNORMAL LOW (ref 80.0–100.0)
Monocytes Absolute: 0.8 10*3/uL (ref 0.1–1.0)
Monocytes Relative: 8 %
NRBC: 0 % (ref 0.0–0.2)
Neutro Abs: 5.6 10*3/uL (ref 1.7–7.7)
Neutrophils Relative %: 53 %
Platelets: 372 10*3/uL (ref 150–400)
RBC: 4.57 MIL/uL (ref 3.87–5.11)
RDW: 20.8 % — ABNORMAL HIGH (ref 11.5–15.5)
WBC: 10.6 10*3/uL — AB (ref 4.0–10.5)

## 2018-10-03 LAB — HCG, SERUM, QUALITATIVE: Preg, Serum: NEGATIVE

## 2018-10-10 ENCOUNTER — Ambulatory Visit (HOSPITAL_COMMUNITY)
Admission: RE | Admit: 2018-10-10 | Discharge: 2018-10-10 | Disposition: A | Discharge status: Home / Self Care | Payer: BLUE CROSS/BLUE SHIELD | Attending: Internal Medicine | Admitting: Internal Medicine

## 2018-10-10 ENCOUNTER — Encounter (HOSPITAL_COMMUNITY): Payer: Self-pay | Admitting: *Deleted

## 2018-10-10 ENCOUNTER — Encounter (HOSPITAL_COMMUNITY)
Admission: RE | Disposition: A | Discharge status: Home / Self Care | Payer: Self-pay | Source: Home / Self Care | Attending: Internal Medicine

## 2018-10-10 ENCOUNTER — Ambulatory Visit (HOSPITAL_COMMUNITY): Payer: BLUE CROSS/BLUE SHIELD | Admitting: Anesthesiology

## 2018-10-10 ENCOUNTER — Other Ambulatory Visit: Payer: Self-pay

## 2018-10-10 DIAGNOSIS — K5730 Diverticulosis of large intestine without perforation or abscess without bleeding: Secondary | ICD-10-CM | POA: Insufficient documentation

## 2018-10-10 DIAGNOSIS — K317 Polyp of stomach and duodenum: Secondary | ICD-10-CM | POA: Insufficient documentation

## 2018-10-10 DIAGNOSIS — R1013 Epigastric pain: Secondary | ICD-10-CM | POA: Diagnosis not present

## 2018-10-10 DIAGNOSIS — D500 Iron deficiency anemia secondary to blood loss (chronic): Secondary | ICD-10-CM | POA: Insufficient documentation

## 2018-10-10 DIAGNOSIS — Z6841 Body Mass Index (BMI) 40.0 and over, adult: Secondary | ICD-10-CM | POA: Diagnosis not present

## 2018-10-10 DIAGNOSIS — K3189 Other diseases of stomach and duodenum: Secondary | ICD-10-CM

## 2018-10-10 DIAGNOSIS — R194 Change in bowel habit: Secondary | ICD-10-CM | POA: Insufficient documentation

## 2018-10-10 DIAGNOSIS — K644 Residual hemorrhoidal skin tags: Secondary | ICD-10-CM | POA: Insufficient documentation

## 2018-10-10 DIAGNOSIS — Z79899 Other long term (current) drug therapy: Secondary | ICD-10-CM | POA: Diagnosis not present

## 2018-10-10 DIAGNOSIS — I10 Essential (primary) hypertension: Secondary | ICD-10-CM | POA: Diagnosis not present

## 2018-10-10 DIAGNOSIS — K319 Disease of stomach and duodenum, unspecified: Secondary | ICD-10-CM | POA: Diagnosis not present

## 2018-10-10 DIAGNOSIS — K449 Diaphragmatic hernia without obstruction or gangrene: Secondary | ICD-10-CM | POA: Insufficient documentation

## 2018-10-10 DIAGNOSIS — Z87891 Personal history of nicotine dependence: Secondary | ICD-10-CM | POA: Insufficient documentation

## 2018-10-10 DIAGNOSIS — R195 Other fecal abnormalities: Secondary | ICD-10-CM | POA: Insufficient documentation

## 2018-10-10 DIAGNOSIS — K219 Gastro-esophageal reflux disease without esophagitis: Secondary | ICD-10-CM | POA: Insufficient documentation

## 2018-10-10 HISTORY — PX: COLONOSCOPY WITH PROPOFOL: SHX5780

## 2018-10-10 HISTORY — PX: BIOPSY: SHX5522

## 2018-10-10 HISTORY — PX: POLYPECTOMY: SHX5525

## 2018-10-10 HISTORY — PX: ESOPHAGOGASTRODUODENOSCOPY (EGD) WITH PROPOFOL: SHX5813

## 2018-10-10 SURGERY — COLONOSCOPY WITH PROPOFOL
Anesthesia: General

## 2018-10-10 MED ORDER — MIDAZOLAM HCL 2 MG/2ML IJ SOLN: 0.5000 mg | Freq: Once | INTRAMUSCULAR | Status: DC | PRN

## 2018-10-10 MED ORDER — KETAMINE HCL 50 MG/5ML IJ SOSY
PREFILLED_SYRINGE | INTRAMUSCULAR | Status: AC
  Filled 2018-10-10: qty 5

## 2018-10-10 MED ORDER — CHLORHEXIDINE GLUCONATE CLOTH 2 % EX PADS: 6.0000 | MEDICATED_PAD | Freq: Once | CUTANEOUS | Status: DC

## 2018-10-10 MED ORDER — PROPOFOL 10 MG/ML IV BOLUS
INTRAVENOUS | Status: DC | PRN
  Administered 2018-10-10 (×3): 20 mg via INTRAVENOUS
  Administered 2018-10-10: 30 mg via INTRAVENOUS
  Administered 2018-10-10: 20 mg via INTRAVENOUS

## 2018-10-10 MED ORDER — FLINTSTONES PLUS IRON PO CHEW: 1.0000 | CHEWABLE_TABLET | Freq: Two times a day (BID) | ORAL | Status: AC

## 2018-10-10 MED ORDER — LACTATED RINGERS IV SOLN: INTRAVENOUS | Status: DC

## 2018-10-10 MED ORDER — STERILE WATER FOR IRRIGATION IR SOLN
Status: DC | PRN
  Administered 2018-10-10: 100 mL

## 2018-10-10 MED ORDER — PROPOFOL 500 MG/50ML IV EMUL
INTRAVENOUS | Status: DC | PRN
  Administered 2018-10-10: 125 ug/kg/min via INTRAVENOUS
  Administered 2018-10-10: 100 ug/kg/min via INTRAVENOUS
  Administered 2018-10-10 (×2): via INTRAVENOUS

## 2018-10-10 MED ORDER — PROPOFOL 10 MG/ML IV BOLUS
INTRAVENOUS | Status: AC
  Filled 2018-10-10: qty 40

## 2018-10-10 MED ORDER — PROMETHAZINE HCL 25 MG/ML IJ SOLN: 6.2500 mg | INTRAMUSCULAR | Status: DC | PRN

## 2018-10-10 MED ORDER — KETAMINE HCL 10 MG/ML IJ SOLN
INTRAMUSCULAR | Status: DC | PRN
  Administered 2018-10-10: 5 mg via INTRAVENOUS
  Administered 2018-10-10: 15 mg via INTRAVENOUS
  Administered 2018-10-10: 10 mg via INTRAVENOUS
  Administered 2018-10-10: 5 mg via INTRAVENOUS
  Administered 2018-10-10: 10 mg via INTRAVENOUS

## 2018-10-10 MED ORDER — LACTATED RINGERS IV SOLN
INTRAVENOUS | Status: DC
  Administered 2018-10-10: 09:00:00 via INTRAVENOUS

## 2018-10-10 MED ORDER — PSYLLIUM 58.6 % PO POWD: 1.0000 | Freq: Three times a day (TID) | ORAL | 12 refills | Status: AC

## 2018-10-10 NOTE — Op Note (Signed)
[facility] Patient Name: Tammy Novak Procedure Date: 10/10/2018 9:03 AM MRN: [id number] Date of Birth: 1971/05/02 Attending MD: Lionel December , MD CSN: [id number] Age: 48 Admit Type: Outpatient Procedure:                Colonoscopy Indications:              Change in bowel habits Providers:                Lionel December, MD, Edrick Kins, RN, Dyann Ruddle Referring MD:             Virgina Norfolk, NP Medicines:                Propofol per Anesthesia Complications:            No immediate complications. Estimated Blood Loss:     Estimated blood loss: none. Procedure:                Pre-Anesthesia Assessment:                           - Prior to the procedure, a History and Physical                            was performed, and patient medications and                            allergies were reviewed. The patient's tolerance of                            previous anesthesia was also reviewed. The risks                            and benefits of the procedure and the sedation                            options and risks were discussed with the patient.                            All questions were answered, and informed consent                            was obtained. Prior Anticoagulants: The patient has                            taken no previous anticoagulant or antiplatelet                            agents. ASA Grade Assessment: III - A patient with                            severe systemic disease. After reviewing the risks                            and benefits, the patient was deemed in  satisfactory condition to undergo the procedure.                           After obtaining informed consent, the colonoscope                            was passed under direct vision. Throughout the                            procedure, the patient's blood pressure, pulse, and                            oxygen saturations were monitored continuously. The                        CF-[id number] ([id number] scope was introduced through                            the anus and advanced to the the cecum, identified                            by appendiceal orifice and ileocecal valve. The                            colonoscopy was technically difficult and complex                            due to significant looping. Successful completion                            of the procedure was aided by changing the patient                            to a supine position and using manual pressure. The                            patient tolerated the procedure well. The quality                            of the bowel preparation was good. The ileocecal                            valve, appendiceal orifice, and rectum were                            photographed. The ileocecal valve, appendiceal                            orifice, and rectum were photographed. Scope In: 9:52:39 AM Scope Out: 10:21:09 AM Scope Withdrawal Time: 0 hours 5 minutes 17 seconds  Total Procedure Duration: 0 hours 28 minutes 30 seconds  Findings:      The perianal and digital rectal examinations were normal.      Scattered medium-mouthed diverticula were found in the sigmoid colon.  The exam was otherwise normal throughout the examined colon.      External hemorrhoids were found during retroflexion. The hemorrhoids       were small. Impression:               - Diverticulosis in the sigmoid colon.                           - External hemorrhoids.                           - No specimens collected. Moderate Sedation:      Per Anesthesia Care Recommendation:           - Patient has a contact number available for                            emergencies. The signs and symptoms of potential                            delayed complications were discussed with the                            patient. Return to normal activities tomorrow.                            Written discharge  instructions were provided to the                            patient.                           - High fiber diet today.                           - Continue present medications.                           - Fintstones chewable one tablet bid.                           - Metamucik 4 g po qhs.                           - Repeat colonoscopy in 10 years for screening                            purposes. Procedure Code(s):        --- Professional ---                           [phone], Colonoscopy, flexible; diagnostic, including                            collection of specimen(s) by brushing or washing,                            when performed (separate procedure) Diagnosis Code(s):        ---  Professional ---                           K64.4, Residual hemorrhoidal skin tags                           R19.4, Change in bowel habit                           K57.30, Diverticulosis of large intestine without                            perforation or abscess without bleeding CPT copyright 2018 [employer]. All rights reserved. The codes documented in this report are preliminary and upon coder review may  be revised to meet current compliance requirements. Lionel DecemberNajeeb Mack Thurmon, MD Lionel DecemberNajeeb Tarynn Garling, MD 10/10/2018 10:34:33 AM This report has been signed electronically. Number of Addenda: 0

## 2018-10-10 NOTE — H&P (Signed)
Tammy Novak is an 48 y.o. female.   Chief Complaint: Patient is here for EGD and colonoscopy. HPI: Patient is 48 year old Caucasian female who has chronic GERD and has been maintained on omeprazole continues to have episodic severe epigastric pain which is relieved with GI cocktail.  She has been evaluated in emergency room and no cause found.  She says when she has pain it takes her a couple of days to get over it.  Heartburn is well controlled with omeprazole.  She denies dysphagia nausea or vomiting.  She also denies melena.  She reports change in her bowel habits.  She had diarrhea for 20 years.  She was never given diagnosis of IBS.  Now she has developed constipation and she can go as many as 2 days without a bowel movement.  Since she has developed constipation she has noted fresh blood with her bowel movements.  It is usually small in amount. Recent blood work revealed microcytic anemia consistent with iron deficiency.  Her hemoglobin 2 months ago was 11 g and now it is 10.5 g.  She has long history of heavy.. Family history is negative for CRC but positive for Crohn's disease in 2 of her first cousins.  Past Medical History:  Diagnosis Date  . Anemia   . Anxiety   . Diarrhea   . GERD (gastroesophageal reflux disease)   . Hiatal hernia   . History of kidney stones   . Hypertension   . Palpitations     Past Surgical History:  Procedure Laterality Date  . APPENDECTOMY    . BIOPSY  05/07/2016   Procedure: BIOPSY;  Surgeon: Malissa Hippo, MD;  Location: [facility];  Service: Endoscopy;;  gastric; duodenum  . CHOLECYSTECTOMY     7-8 yrs ago for gallstones  . ESOPHAGOGASTRODUODENOSCOPY (EGD) WITH PROPOFOL N/A 05/07/2016   Procedure: ESOPHAGOGASTRODUODENOSCOPY (EGD) WITH PROPOFOL;  Surgeon: Malissa Hippo, MD;  Location: [facility];  Service: Endoscopy;  Laterality: N/A;  . LAPAROSCOPIC APPENDECTOMY N/A 08/25/2013   Procedure: APPENDECTOMY LAPAROSCOPIC;  Surgeon: Dalia Heading,  MD;  Location: [facility];  Service: General;  Laterality: N/A;  . LEEP    . TUBAL LIGATION      History reviewed. No pertinent family history. Social History:  reports that she quit smoking about 12 years ago. She has a 15.00 pack-year smoking history. She has never used smokeless tobacco. She reports that she does not drink alcohol or use drugs.  Allergies:  Allergies  Allergen Reactions  . Aspirin     Causes acid reflux.   . Morphine And Related     Drops blood pressure.   . Sulfa Antibiotics     Medications Prior to Admission  Medication Sig Dispense Refill  . albuterol (PROVENTIL HFA;VENTOLIN HFA) 108 (90 Base) MCG/ACT inhaler Inhale 2 puffs into the lungs every 6 (six) hours as needed for wheezing or shortness of breath (cough, shortness of breath or wheezing.). 1 Inhaler 1  . ALPRAZolam (XANAX) 0.25 MG tablet Take 0.25 mg by mouth 3 (three) times daily as needed for anxiety.     Marland Kitchen lisinopril-hydrochlorothiazide (PRINZIDE,ZESTORETIC) 20-12.5 MG per tablet Take 2 tablets by mouth daily.     . metoprolol tartrate (LOPRESSOR) 25 MG tablet Take 25 mg by mouth 2 (two) times daily.    Marland Kitchen omeprazole (PRILOSEC) 40 MG capsule Take 40 mg by mouth daily.    Marland Kitchen OVER THE COUNTER MEDICATION Take 10 mLs by mouth 2 (two) times daily.  Floravital Iron Supplement      No results found for this or any previous visit (from the past 48 hour(s)). No results found.  ROS  Blood pressure 136/67, pulse 77, temperature 98.5 F (36.9 C), temperature source Oral, resp. rate 20, height 5\' 1"  (1.549 m), weight 122.5 kg, last menstrual period 09/12/2018, SpO2 96 %. Physical Exam  Constitutional: She appears well-developed and well-nourished.  HENT:  Mouth/Throat: Oropharynx is clear and moist.  Eyes: Conjunctivae are normal. No scleral icterus.  Neck: No thyromegaly present.  Cardiovascular: Normal rate, regular rhythm and normal heart sounds.  No murmur heard. Respiratory: Effort normal and breath sounds  normal.  GI:  Abdomen is full.  Soft and nontender with organomegaly or masses.  Musculoskeletal:        General: No edema.  Lymphadenopathy:    She has no cervical adenopathy.  Neurological: She is alert.  Skin: Skin is warm and dry.     Assessment/Plan Epigastric pain and chronic GERD. Change in bowel habits. Iron deficiency anemia felt to be due to uterine blood loss. Diagnostic EGD and colonoscopy.  Lionel December, MD 10/10/2018, 9:23 AM

## 2018-10-10 NOTE — Anesthesia Postprocedure Evaluation (Signed)
Anesthesia Post Note  Patient: Tammy Novak  Procedure(s) Performed: COLONOSCOPY WITH PROPOFOL (N/A ) ESOPHAGOGASTRODUODENOSCOPY (EGD) WITH PROPOFOL (N/A ) BIOPSY POLYPECTOMY  Patient location during evaluation: PACU Anesthesia Type: General Level of consciousness: awake and alert and patient cooperative Pain management: satisfactory to patient Vital Signs Assessment: post-procedure vital signs reviewed and stable Cardiovascular status: stable Postop Assessment: no apparent nausea or vomiting Anesthetic complications: no     Last Vitals:  Vitals:   10/10/18 0755  BP: 136/67  Pulse: 77  Resp: 20  Temp: 36.9 C  SpO2: 96%    Last Pain:  Vitals:   10/10/18 1000  TempSrc:   PainSc: 0-No pain                 Rhianna Raulerson

## 2018-10-10 NOTE — Transfer of Care (Signed)
Immediate Anesthesia Transfer of Care Note  Patient: Tammy Novak  Procedure(s) Performed: COLONOSCOPY WITH PROPOFOL (N/A ) ESOPHAGOGASTRODUODENOSCOPY (EGD) WITH PROPOFOL (N/A ) BIOPSY POLYPECTOMY  Patient Location: PACU  Anesthesia Type:General  Level of Consciousness: awake, alert  and patient cooperative  Airway & Oxygen Therapy: Patient Spontanous Breathing  Post-op Assessment: Report given to RN and Post -op Vital signs reviewed and stable  Post vital signs: Reviewed and stable  Last Vitals:  Vitals Value Taken Time  BP 94/43 10/10/2018 10:27 AM  Temp    Pulse 74 10/10/2018 10:30 AM  Resp 19 10/10/2018 10:30 AM  SpO2 97 % 10/10/2018 10:30 AM  Vitals shown include unvalidated device data.  Last Pain:  Vitals:   10/10/18 1000  TempSrc:   PainSc: 0-No pain      Patients Stated Pain Goal: 8 (10/10/18 0755)  Complications: No apparent anesthesia complications

## 2018-10-10 NOTE — Addendum Note (Signed)
Addendum  created 10/10/18 1041 by Franco Nones, CRNA   Charge Capture section accepted

## 2018-10-10 NOTE — Op Note (Signed)
[facility] Patient Name: Tammy Novak Procedure Date: 10/10/2018 9:09 AM MRN: [id number] Date of Birth: 1971-01-12 Attending MD: Lionel December , MD CSN: [id number] Age: 48 Admit Type: Outpatient Procedure:                Upper GI endoscopy Indications:              Epigastric abdominal pain, Gastro-esophageal reflux                            disease, Follow-up of gastro-esophageal reflux                            disease Providers:                Lionel December, MD, Edrick Kins, RN, Dyann Ruddle Referring MD:             Virgina Norfolk, NP Medicines:                Propofol per Anesthesia Complications:            No immediate complications. Estimated Blood Loss:     Estimated blood loss was minimal. Procedure:                Pre-Anesthesia Assessment:                           - Prior to the procedure, a History and Physical                            was performed, and patient medications and                            allergies were reviewed. The patient's tolerance of                            previous anesthesia was also reviewed. The risks                            and benefits of the procedure and the sedation                            options and risks were discussed with the patient.                            All questions were answered, and informed consent                            was obtained. Prior Anticoagulants: The patient has                            taken no previous anticoagulant or antiplatelet                            agents. ASA Grade Assessment: III - A patient with  severe systemic disease. After reviewing the risks                            and benefits, the patient was deemed in                            satisfactory condition to undergo the procedure.                           After obtaining informed consent, the endoscope was                            passed under direct vision. Throughout the               procedure, the patient's blood pressure, pulse, and                            oxygen saturations were monitored continuously. The                            GIF-H190 ([id number]) was introduced through the and                            advanced to the second part of duodenum. The upper                            GI endoscopy was accomplished without difficulty.                            The patient tolerated the procedure well. Scope In: 9:38:39 AM Scope Out: 9:48:17 AM Total Procedure Duration: 0 hours 9 minutes 38 seconds  Findings:      The examined esophagus was normal.      The Z-line was irregular and was found 29 cm from the incisors.      A 4 cm hiatal hernia was present.      A few 3 to 6 mm pedunculated and sessile polyps with no stigmata of       recent bleeding were found in the gastric fundus and in the gastric       body. Biopsies were taken with a cold forceps for histology. The       pathology specimen was placed into Bottle Number 2.      A single erosion was found in the gastric antrum. Biopsies were taken       with a cold forceps for histology.      The duodenal bulb and second portion of the duodenum were normal. Impression:               - Normal esophagus.                           - Z-line irregular, 29 cm from the incisors.                           - 4 cm hiatal hernia.                           -  A few gastric polyps. Biopsied.                           - Erosive gastropathy. Biopsied.                           - Normal duodenal bulb and second portion of the                            duodenum. Moderate Sedation:      Per Anesthesia Care Recommendation:           - Patient has a contact number available for                            emergencies. The signs and symptoms of potential                            delayed complications were discussed with the                            patient. Return to normal activities tomorrow.                             Written discharge instructions were provided to the                            patient.                           - High fiber diet today.                           - Continue present medications.                           - No aspirin, ibuprofen, naproxen, or other                            non-steroidal anti-inflammatory drugs for 1 day.                           - Await pathology results. Procedure Code(s):        --- Professional ---                           [phone], Esophagogastroduodenoscopy, flexible,                            transoral; with biopsy, single or multiple Diagnosis Code(s):        --- Professional ---                           K22.8, Other specified diseases of esophagus                           K44.9, Diaphragmatic hernia without obstruction or  gangrene                           K31.7, Polyp of stomach and duodenum                           K31.89, Other diseases of stomach and duodenum                           R10.13, Epigastric pain                           K21.9, Gastro-esophageal reflux disease without                            esophagitis CPT copyright 2018 American [employer] Association. All rights reserved. The codes documented in this report are preliminary and upon coder review may  be revised to meet current compliance requirements. Lionel DecemberNajeeb Shatisha Falter, MD Lionel DecemberNajeeb Prentis Langdon, MD 10/10/2018 10:29:52 AM This report has been signed electronically. Number of Addenda: 0

## 2018-10-10 NOTE — Discharge Instructions (Signed)
Resume usual medications as before. [place] chewable with iron 1 tablet twice daily. Metamucil 4 g by mouth daily at bedtime. High-fiber diet. No driving for 24 hours. Physician will call with biopsy results.    Upper Endoscopy, Adult, Care After This sheet gives you information about how to care for yourself after your procedure. Your health care provider may also give you more specific instructions. If you have problems or questions, contact your health care provider. What can I expect after the procedure? After the procedure, it is common to have:  A sore throat.  Mild stomach pain or discomfort.  Bloating.  Nausea. Follow these instructions at home:   Follow instructions from your health care provider about what to eat or drink after your procedure.  Return to your normal activities as told by your health care provider. Ask your health care provider what activities are safe for you.  Take over-the-counter and prescription medicines only as told by your health care provider.  Do not drive for 24 hours if you were given a sedative during your procedure.  Keep all follow-up visits as told by your health care provider. This is important. Contact a health care provider if you have:  A sore throat that lasts longer than one day.  Trouble swallowing. Get help right away if:  You vomit blood or your vomit looks like coffee grounds.  You have: ? A fever. ? Bloody, black, or tarry stools. ? A severe sore throat or you cannot swallow. ? Difficulty breathing. ? Severe pain in your chest or abdomen. Summary  After the procedure, it is common to have a sore throat, mild stomach discomfort, bloating, and nausea.  Do not drive for 24 hours if you were given a sedative during the procedure.  Follow instructions from your health care provider about what to eat or drink after your procedure.  Return to your normal activities as told by your health care provider. This  information is not intended to replace advice given to you by your health care provider. Make sure you discuss any questions you have with your health care provider. Document Released: 03/11/2012 Document Revised: 02/10/2018 Document Reviewed: 02/10/2018 Elsevier Interactive Patient Education  2019 [employer]   Colonoscopy, Adult, Care After This sheet gives you information about how to care for yourself after your procedure. Your health care provider may also give you more specific instructions. If you have problems or questions, contact your health care provider. What can I expect after the procedure? After the procedure, it is common to have:  A small amount of blood in your stool for 24 hours after the procedure.  Some gas.  Mild abdominal cramping or bloating. Follow these instructions at home: General instructions  For the first 24 hours after the procedure: ? Do not drive or use machinery. ? Do not sign important documents. ? Do not drink alcohol. ? Do your regular daily activities at a slower pace than normal. ? Eat soft, easy-to-digest foods.  Take over-the-counter or prescription medicines only as told by your health care provider. Relieving cramping and bloating   Try walking around when you have cramps or feel bloated.  Apply heat to your abdomen as told by your health care provider. Use a heat source that your health care provider recommends, such as a moist heat pack or a heating pad. ? Place a towel between your skin and the heat source. ? Leave the heat on for 20-30 minutes. ? Remove the heat if your  skin turns bright red. This is especially important if you are unable to feel pain, heat, or cold. You may have a greater risk of getting burned. Eating and drinking   Drink enough fluid to keep your urine pale yellow.  Resume your normal diet as instructed by your health care provider. Avoid heavy or fried foods that are hard to digest.  Avoid drinking  alcohol for as long as instructed by your health care provider. Contact a health care provider if:  You have blood in your stool 2-3 days after the procedure. Get help right away if:  You have more than a small spotting of blood in your stool.  You pass large blood clots in your stool.  Your abdomen is swollen.  You have nausea or vomiting.  You have a fever.  You have increasing abdominal pain that is not relieved with medicine. Summary  After the procedure, it is common to have a small amount of blood in your stool. You may also have mild abdominal cramping and bloating.  For the first 24 hours after the procedure, do not drive or use machinery, sign important documents, or drink alcohol.  Contact your health care provider if you have a lot of blood in your stool, nausea or vomiting, a fever, or increased abdominal pain. This information is not intended to replace advice given to you by your health care provider. Make sure you discuss any questions you have with your health care provider. Document Released: 04/24/2004 Document Revised: 07/03/2017 Document Reviewed: 11/22/2015 Elsevier Interactive Patient Education  2019 [employer] Inc[employer]   High-Fiber Diet Fiber, also called dietary fiber, is a type of carbohydrate that is found in fruits, vegetables, whole grains, and beans. A high-fiber diet can have many health benefits. Your health care provider may recommend a high-fiber diet to help:  Prevent constipation. Fiber can make your bowel movements more regular.  Lower your cholesterol.  Relieve the following conditions: ? Swelling of veins in the anus (hemorrhoids). ? Swelling and irritation (inflammation) of specific areas of the digestive tract (uncomplicated diverticulosis). ? A problem of the large intestine (colon) that sometimes causes pain and diarrhea (irritable bowel syndrome, IBS).  Prevent overeating as part of a weight-loss plan.  Prevent heart disease, type 2  diabetes, and certain cancers. What is my plan? The recommended daily fiber intake in grams (g) includes:  38 g for men age 40 or younger.  30 g for men over age 81.  25 g for women age 64 or younger.  21 g for women over age 68. You can get the recommended daily intake of dietary fiber by:  Eating a variety of fruits, vegetables, grains, and beans.  Taking a fiber supplement, if it is not possible to get enough fiber through your diet. What do I need to know about a high-fiber diet?  It is better to get fiber through food sources rather than from fiber supplements. There is not a lot of research about how effective supplements are.  Always check the fiber content on the nutrition facts label of any prepackaged food. Look for foods that contain 5 g of fiber or more per serving.  Talk with a diet and nutrition specialist (dietitian) if you have questions about specific foods that are recommended or not recommended for your medical condition, especially if those foods are not listed below.  Gradually increase how much fiber you consume. If you increase your intake of dietary fiber too quickly, you may have bloating, cramping,  or gas.  Drink plenty of water. Water helps you to digest fiber. What are tips for following this plan?  Eat a wide variety of high-fiber foods.  Make sure that half of the grains that you eat each day are whole grains.  Eat breads and cereals that are made with whole-grain flour instead of refined flour or white flour.  Eat brown rice, bulgur wheat, or millet instead of white rice.  Start the day with a breakfast that is high in fiber, such as a cereal that contains 5 g of fiber or more per serving.  Use beans in place of meat in soups, salads, and pasta dishes.  Eat high-fiber snacks, such as berries, raw vegetables, nuts, and popcorn.  Choose whole fruits and vegetables instead of processed forms like juice or sauce. What foods can I  eat?  Fruits Berries. Pears. Apples. Oranges. Avocado. Prunes and raisins. Dried figs. Vegetables Sweet potatoes. Spinach. Kale. Artichokes. Cabbage. Broccoli. Cauliflower. Green peas. Carrots. Squash. Grains Whole-grain breads. Multigrain cereal. Oats and oatmeal. Brown rice. Barley. Bulgur wheat. Millet. Quinoa. Bran muffins. Popcorn. Rye wafer crackers. Meats and other proteins Navy, kidney, and pinto beans. Soybeans. Split peas. Lentils. Nuts and seeds. Dairy Fiber-fortified yogurt. Beverages Fiber-fortified soy milk. Fiber-fortified orange juice. Other foods Fiber bars. The items listed above may not be a complete list of recommended foods and beverages. Contact a dietitian for more options. What foods are not recommended? Fruits Fruit juice. Cooked, strained fruit. Vegetables Fried potatoes. Canned vegetables. Well-cooked vegetables. Grains White bread. Pasta made with refined flour. White rice. Meats and other proteins Fatty cuts of meat. Fried chicken or fried fish. Dairy Milk. Yogurt. Cream cheese. Sour cream. Fats and oils Butters. Beverages Soft drinks. Other foods Cakes and pastries. The items listed above may not be a complete list of foods and beverages to avoid. Contact a dietitian for more information. Summary  Fiber is a type of carbohydrate. It is found in fruits, vegetables, whole grains, and beans.  There are many health benefits of eating a high-fiber diet, such as preventing constipation, lowering blood cholesterol, helping with weight loss, and reducing your risk of heart disease, diabetes, and certain cancers.  Gradually increase your intake of fiber. Increasing too fast can result in cramping, bloating, and gas. Drink plenty of water while you increase your fiber.  The best sources of fiber include whole fruits and vegetables, whole grains, nuts, seeds, and beans. This information is not intended to replace advice given to you by your health care  provider. Make sure you discuss any questions you have with your health care provider. Document Released: 09/10/2005 Document Revised: 07/15/2017 Document Reviewed: 07/15/2017 Elsevier Interactive Patient Education  2019 Else[employer] Inc.   Gastric Polyps A gastric polyp, also called a stomach polyp, is a growth on the lining of the stomach. Most polyps are not dangerous, but some can be harmful because of their size, location, or type. Polyps that can become harmful include:  Large polyps. These can turn into sores (ulcers). Ulcers can lead to stomach bleeding.  Polyps that block food from moving from the stomach to the small intestine (gastric outlet obstruction).  A type of polyp called an adenoma. This type of polyp can become cancerous. What are the causes? Gastric polyps form when the lining of the stomach gets inflamed or damaged. Stomach inflammation and damage may be caused by:  A long-lasting stomach condition, such as gastritis.  Certain medicines used to reduce stomach acid.  An inherited  condition called familial adenomatous polyposis. What are the signs or symptoms? Usually, this condition does not cause any symptoms. If you do have symptoms, they may include:  Pain or tenderness in the abdomen.  Nausea.  Trouble eating or swallowing.  Blood in the stool.  Anemia. How is this diagnosed? Gastric polyps are diagnosed with:  A medical procedure called endoscopy.  A lab test in which a part of the polyp is examined. This test is done with a sample of polyp tissue (biopsy) taken during an endoscopy. How is this treated? Treatment depends on the type, location, and size of the polyps. Treatment may involve:  Having the polyps checked regularly with an endoscopy.  Having the polyps removed with an endoscopy. This may be done if the polyps are harmful or can become harmful. Removing a polyp often prevents problems from developing.  Having the polyps removed with a  surgery called a partial gastrectomy. This may be done in rare cases to remove very large polyps.  Treating the underlying condition that caused the polyps. Follow these instructions at home:  Take over-the-counter and prescription medicines only as told by your health care provider.  Keep all follow-up visits as told by your health care provider. This is important. Contact a health care provider if:  You develop new symptoms.  Your symptoms get worse. Get help right away if:  You vomit blood.  You have severe abdominal pain.  You cannot eat or drink.  You have blood in your stool. This information is not intended to replace advice given to you by your health care provider. Make sure you discuss any questions you have with your health care provider. Document Released: 08/27/2012 Document Revised: 01/30/2016 Document Reviewed: 09/25/2015 Elsevier Interactive Patient Education  2019 [employer]   Diverticulosis  Diverticulosis is a condition that develops when small pouches (diverticula) form in the wall of the large intestine (colon). The colon is where water is absorbed and stool is formed. The pouches form when the inside layer of the colon pushes through weak spots in the outer layers of the colon. You may have a few pouches or many of them. What are the causes? The cause of this condition is not known. What increases the risk? The following factors may make you more likely to develop this condition:  Being older than age 50. Your risk for this condition increases with age. Diverticulosis is rare among people younger than age 50. By age 50, many people have it.  Eating a low-fiber diet.  Having frequent constipation.  Being overweight.  Not getting enough exercise.  Smoking.  Taking over-the-counter pain medicines, like aspirin and ibuprofen.  Having a family history of diverticulosis. What are the signs or symptoms? In most people, there are no symptoms of  this condition. If you do have symptoms, they may include:  Bloating.  Cramps in the abdomen.  Constipation or diarrhea.  Pain in the lower left side of the abdomen. How is this diagnosed? This condition is most often diagnosed during an exam for other colon problems. Because diverticulosis usually has no symptoms, it often cannot be diagnosed independently. This condition may be diagnosed by:  Using a flexible scope to examine the colon (colonoscopy).  Taking an X-ray of the colon after dye has been put into the colon (barium enema).  Doing a CT scan. How is this treated? You may not need treatment for this condition if you have never developed an infection related to diverticulosis. If you  have had an infection before, treatment may include:  Eating a high-fiber diet. This may include eating more fruits, vegetables, and grains.  Taking a fiber supplement.  Taking a live bacteria supplement (probiotic).  Taking medicine to relax your colon.  Taking antibiotic medicines. Follow these instructions at home:  Drink 6-8 glasses of water or more each day to prevent constipation.  Try not to strain when you have a bowel movement.  If you have had an infection before: ? Eat more fiber as directed by your health care provider or your diet and nutrition specialist (dietitian). ? Take a fiber supplement or probiotic, if your health care provider approves.  Take over-the-counter and prescription medicines only as told by your health care provider.  If you were prescribed an antibiotic, take it as told by your health care provider. Do not stop taking the antibiotic even if you start to feel better.  Keep all follow-up visits as told by your health care provider. This is important. Contact a health care provider if:  You have pain in your abdomen.  You have bloating.  You have cramps.  You have not had a bowel movement in 3 days. Get help right away if:  Your pain gets  worse.  Your bloating becomes very bad.  You have a fever or chills, and your symptoms suddenly get worse.  You vomit.  You have bowel movements that are bloody or black.  You have bleeding from your rectum. Summary  Diverticulosis is a condition that develops when small pouches (diverticula) form in the wall of the large intestine (colon).  You may have a few pouches or many of them.  This condition is most often diagnosed during an exam for other colon problems.  If you have had an infection related to diverticulosis, treatment may include increasing the fiber in your diet, taking supplements, or taking medicines. This information is not intended to replace advice given to you by your health care provider. Make sure you discuss any questions you have with your health care provider. Document Released: 06/07/2004 Document Revised: 07/30/2016 Document Reviewed: 07/30/2016 Elsevier Interactive Patient Education  2019 [employer].

## 2018-10-10 NOTE — Anesthesia Preprocedure Evaluation (Signed)
Anesthesia Evaluation  Patient identified by MRN, date of birth, ID band Patient awake    Reviewed: Allergy & Precautions, NPO status , Patient's Chart, lab work & pertinent test results, reviewed documented beta blocker date and time   Airway Mallampati: II  TM Distance: >3 FB Neck ROM: Full    Dental no notable dental hx. (+) Teeth Intact   Pulmonary neg pulmonary ROS, former smoker,    Pulmonary exam normal breath sounds clear to auscultation       Cardiovascular Exercise Tolerance: Good hypertension, Pt. on medications negative cardio ROS Normal cardiovascular examI Rhythm:Regular Rate:Normal  States good on level ground -states out of shape -increased BMI >50    Neuro/Psych Anxiety negative neurological ROS  negative psych ROS   GI/Hepatic Neg liver ROS, hiatal hernia, GERD  Medicated and Controlled,  Endo/Other  Morbid obesity  Renal/GU negative Renal ROS  negative genitourinary   Musculoskeletal negative musculoskeletal ROS (+)   Abdominal   Peds negative pediatric ROS (+)  Hematology negative hematology ROS (+) anemia ,   Anesthesia Other Findings   Reproductive/Obstetrics negative OB ROS                             Anesthesia Physical Anesthesia Plan  ASA: III  Anesthesia Plan: General   Post-op Pain Management:    Induction: Intravenous  PONV Risk Score and Plan:   Airway Management Planned: Nasal Cannula and Simple Face Mask  Additional Equipment:   Intra-op Plan:   Post-operative Plan:   Informed Consent: I have reviewed the patients History and Physical, chart, labs and discussed the procedure including the risks, benefits and alternatives for the proposed anesthesia with the patient or authorized representative who has indicated his/her understanding and acceptance.     Dental advisory given  Plan Discussed with: CRNA  Anesthesia Plan Comments:          Anesthesia Quick Evaluation

## 2018-10-14 ENCOUNTER — Encounter (HOSPITAL_COMMUNITY): Payer: Self-pay | Admitting: Internal Medicine

## 2018-10-20 ENCOUNTER — Encounter (INDEPENDENT_AMBULATORY_CARE_PROVIDER_SITE_OTHER): Payer: Self-pay | Admitting: *Deleted

## 2018-10-20 ENCOUNTER — Other Ambulatory Visit (INDEPENDENT_AMBULATORY_CARE_PROVIDER_SITE_OTHER): Payer: Self-pay | Admitting: *Deleted

## 2018-10-20 DIAGNOSIS — D649 Anemia, unspecified: Secondary | ICD-10-CM

## 2018-11-10 LAB — HEMOGLOBIN AND HEMATOCRIT, BLOOD
HCT: 35.8 % (ref 35.0–45.0)
Hemoglobin: 10.9 g/dL — ABNORMAL LOW (ref 11.7–15.5)

## 2018-11-13 ENCOUNTER — Other Ambulatory Visit (INDEPENDENT_AMBULATORY_CARE_PROVIDER_SITE_OTHER): Payer: Self-pay | Admitting: *Deleted

## 2018-11-13 DIAGNOSIS — D649 Anemia, unspecified: Secondary | ICD-10-CM

## 2018-11-19 ENCOUNTER — Other Ambulatory Visit (INDEPENDENT_AMBULATORY_CARE_PROVIDER_SITE_OTHER): Payer: Self-pay | Admitting: *Deleted

## 2018-11-19 ENCOUNTER — Encounter (INDEPENDENT_AMBULATORY_CARE_PROVIDER_SITE_OTHER): Payer: Self-pay | Admitting: *Deleted

## 2018-11-19 DIAGNOSIS — D649 Anemia, unspecified: Secondary | ICD-10-CM

## 2018-11-23 ENCOUNTER — Encounter (HOSPITAL_COMMUNITY): Payer: Self-pay | Admitting: Emergency Medicine

## 2018-11-23 ENCOUNTER — Other Ambulatory Visit: Payer: Self-pay

## 2018-11-23 ENCOUNTER — Emergency Department (HOSPITAL_COMMUNITY)
Admission: EM | Admit: 2018-11-23 | Discharge: 2018-11-23 | Disposition: A | Discharge status: Home / Self Care | Payer: BLUE CROSS/BLUE SHIELD | Attending: Emergency Medicine | Admitting: Emergency Medicine

## 2018-11-23 ENCOUNTER — Emergency Department (HOSPITAL_COMMUNITY): Payer: BLUE CROSS/BLUE SHIELD

## 2018-11-23 DIAGNOSIS — Z79899 Other long term (current) drug therapy: Secondary | ICD-10-CM | POA: Insufficient documentation

## 2018-11-23 DIAGNOSIS — J101 Influenza due to other identified influenza virus with other respiratory manifestations: Secondary | ICD-10-CM | POA: Insufficient documentation

## 2018-11-23 DIAGNOSIS — I10 Essential (primary) hypertension: Secondary | ICD-10-CM | POA: Diagnosis not present

## 2018-11-23 DIAGNOSIS — J111 Influenza due to unidentified influenza virus with other respiratory manifestations: Secondary | ICD-10-CM

## 2018-11-23 DIAGNOSIS — Z87891 Personal history of nicotine dependence: Secondary | ICD-10-CM | POA: Diagnosis not present

## 2018-11-23 DIAGNOSIS — R05 Cough: Secondary | ICD-10-CM | POA: Diagnosis present

## 2018-11-23 LAB — INFLUENZA PANEL BY PCR (TYPE A & B)
Influenza A By PCR: POSITIVE — AB
Influenza B By PCR: NEGATIVE

## 2018-11-23 MED ORDER — HYDROCOD POLST-CPM POLST ER 10-8 MG/5ML PO SUER: 5.0000 mL | Freq: Two times a day (BID) | ORAL | 0 refills | Status: AC

## 2018-11-23 MED ORDER — IPRATROPIUM-ALBUTEROL 0.5-2.5 (3) MG/3ML IN SOLN
3.0000 mL | Freq: Once | RESPIRATORY_TRACT | Status: AC
  Administered 2018-11-23: 3 mL via RESPIRATORY_TRACT
  Filled 2018-11-23: qty 3

## 2018-11-23 MED ORDER — PROMETHAZINE-DM 6.25-15 MG/5ML PO SYRP: 5.0000 mL | ORAL_SOLUTION | Freq: Four times a day (QID) | ORAL | 0 refills | Status: DC | PRN

## 2018-11-23 NOTE — Discharge Instructions (Signed)
Tylenol every 4 hours if needed for fever or body aches.  Drink plenty of fluids.  Follow-up with your primary doctor or return here for any worsening symptoms.

## 2018-11-23 NOTE — ED Triage Notes (Signed)
Pt states having productive cough, fever and back pain x 5 days

## 2018-11-24 NOTE — ED Provider Notes (Signed)
[facility] EMERGENCY DEPARTMENT Provider Note   CSN: [id number] Arrival date & time: 11/23/18  1051    History   Chief Complaint Chief Complaint  Patient presents with  . Cough  . Fever    HPI Tammy Novak is a 48 y.o. female.     HPI  Tammy Novak is a 48 y.o. female who presents to the Emergency Department complaining of cough, fever, and generalized body aches.  Symptoms have been present for 5 days.  She states her fever has been low-grade.  She describes her cough as productive.  She is employed as a Nutritional therapist and has been exposed to influenza.  She did receive a influenza vaccine this season.  She has taken over-the-counter Tylenol with some relief of fever.  She denies chest pain or shortness of breath, abdominal pain, vomiting, dysuria and neck pain or stiffness.   Past Medical History:  Diagnosis Date  . Anemia   . Anxiety   . Diarrhea   . GERD (gastroesophageal reflux disease)   . Hiatal hernia   . History of kidney stones   . Hypertension   . Palpitations     Patient Active Problem List   Diagnosis Date Noted  . Change in stool 09/11/2018  . Gastroesophageal reflux disease without esophagitis 05/03/2016  . Diarrhea 08/23/2015  . Acute appendicitis 08/25/2013    Past Surgical History:  Procedure Laterality Date  . APPENDECTOMY    . BIOPSY  05/07/2016   Procedure: BIOPSY;  Surgeon: Malissa Hippo, MD;  Location: [facility];  Service: Endoscopy;;  gastric; duodenum  . BIOPSY  10/10/2018   Procedure: BIOPSY;  Surgeon: Malissa Hippo, MD;  Location: [facility];  Service: Endoscopy;;  fundus bx's  . CHOLECYSTECTOMY     7-8 yrs ago for gallstones  . COLONOSCOPY WITH PROPOFOL N/A 10/10/2018   Procedure: COLONOSCOPY WITH PROPOFOL;  Surgeon: Malissa Hippo, MD;  Location: [facility];  Service: Endoscopy;  Laterality: N/A;  9:10  . ESOPHAGOGASTRODUODENOSCOPY (EGD) WITH PROPOFOL N/A 05/07/2016   Procedure: ESOPHAGOGASTRODUODENOSCOPY (EGD) WITH  PROPOFOL;  Surgeon: Malissa Hippo, MD;  Location: [facility];  Service: Endoscopy;  Laterality: N/A;  . ESOPHAGOGASTRODUODENOSCOPY (EGD) WITH PROPOFOL N/A 10/10/2018   Procedure: ESOPHAGOGASTRODUODENOSCOPY (EGD) WITH PROPOFOL;  Surgeon: Malissa Hippo, MD;  Location: [facility];  Service: Endoscopy;  Laterality: N/A;  . LAPAROSCOPIC APPENDECTOMY N/A 08/25/2013   Procedure: APPENDECTOMY LAPAROSCOPIC;  Surgeon: Dalia Heading, MD;  Location: [facility];  Service: General;  Laterality: N/A;  . LEEP    . POLYPECTOMY  10/10/2018   Procedure: POLYPECTOMY;  Surgeon: Malissa Hippo, MD;  Location: [facility];  Service: Endoscopy;;  gastric polyps  . TUBAL LIGATION       OB History   No obstetric history on file.      Home Medications    Prior to Admission medications   Medication Sig Start Date End Date Taking? Authorizing Provider  albuterol (PROVENTIL HFA;VENTOLIN HFA) 108 (90 Base) MCG/ACT inhaler Inhale 2 puffs into the lungs every 6 (six) hours as needed for wheezing or shortness of breath (cough, shortness of breath or wheezing.). 01/04/18   Bing Neighbors, FNP  ALPRAZolam Prudy Feeler) 0.25 MG tablet Take 0.25 mg by mouth 3 (three) times daily as needed for anxiety.     [provider]  chlorpheniramine-HYDROcodone (TUSSIONEX PENNKINETIC ER) 10-8 MG/5ML SUER Take 5 mLs by mouth 2 (two) times daily. 11/23/18   Mahira Petras,  Marti Acebo, PA-C  lisinopril-hydrochlorothiazide (PRINZIDE,ZESTORETIC) 20-12.5 MG per tablet Take 2 tablets by mouth daily.     [provider]  metoprolol tartrate (LOPRESSOR) 25 MG tablet Take 25 mg by mouth 2 (two) times daily.    [provider]  omeprazole (PRILOSEC) 40 MG capsule Take 40 mg by mouth daily.    [provider]  OVER THE COUNTER MEDICATION Take 10 mLs by mouth 2 (two) times daily. Floravital Iron Supplement    [provider]  Pediatric Multivitamins-Iron (FLINTSTONES PLUS IRON) chewable tablet Chew 1 tablet by  mouth 2 (two) times daily. 10/10/18   Rehman, Joline Maxcy, MD  psyllium (METAMUCIL SMOOTH TEXTURE) 58.6 % powder Take 1 packet by mouth 3 (three) times daily. 10/10/18   Malissa Hippo, MD    Family History No family history on file.  Social History Social History   Tobacco Use  . Smoking status: Former Smoker    Packs/day: 1.00    Years: 15.00    Pack years: 15.00    Last attempt to quit: 05/03/2006    Years since quitting: 12.5  . Smokeless tobacco: Never Used  Substance Use Topics  . Alcohol use: No  . Drug use: No     Allergies   Aspirin; Morphine and related; and Sulfa antibiotics   Review of Systems Review of Systems  Constitutional: Positive for fever. Negative for activity change, appetite change and chills.  HENT: Positive for congestion. Negative for facial swelling, rhinorrhea, sore throat and trouble swallowing.   Eyes: Negative for visual disturbance.  Respiratory: Positive for cough. Negative for shortness of breath, wheezing and stridor.   Gastrointestinal: Negative for abdominal pain, nausea and vomiting.  Genitourinary: Negative for decreased urine volume and dysuria.  Musculoskeletal: Positive for myalgias (Generalized body aches). Negative for neck pain and neck stiffness.  Skin: Negative for rash.  Neurological: Negative for dizziness, weakness, numbness and headaches.  Hematological: Negative for adenopathy.  Psychiatric/Behavioral: Negative for confusion.     Physical Exam Updated Vital Signs BP 124/71   Pulse 87   Temp 98.1 F (36.7 C) (Oral)   Resp 16   Ht 5\' 1"  (1.549 m)   Wt 117.9 kg   SpO2 96%   BMI 49.13 kg/m   Physical Exam Vitals signs and nursing note reviewed.  Constitutional:      General: She is not in acute distress.    Appearance: She is well-developed.  HENT:     Head:     Jaw: No trismus.     Right Ear: Tympanic membrane and ear canal normal.     Left Ear: Tympanic membrane and ear canal normal.     Nose: Mucosal  edema and congestion present. No rhinorrhea.     Mouth/Throat:     Mouth: Mucous membranes are moist.     Pharynx: Oropharynx is clear. Uvula midline. No oropharyngeal exudate, posterior oropharyngeal erythema or uvula swelling.     Tonsils: No tonsillar abscesses.  Eyes:     Conjunctiva/sclera: Conjunctivae normal.  Neck:     Musculoskeletal: Full passive range of motion without pain and normal range of motion. No neck rigidity or muscular tenderness.     Trachea: Phonation normal.  Cardiovascular:     Rate and Rhythm: Normal rate and regular rhythm.     Pulses: Normal pulses.     Heart sounds: No murmur.  Pulmonary:     Effort: Pulmonary effort is normal. No respiratory distress.     Breath sounds: Normal  breath sounds. No wheezing or rales.  Abdominal:     General: There is no distension.     Palpations: Abdomen is soft.     Tenderness: There is no abdominal tenderness. There is no guarding or rebound.  Musculoskeletal: Normal range of motion.  Lymphadenopathy:     Cervical: No cervical adenopathy.  Skin:    General: Skin is warm and dry.  Neurological:     General: No focal deficit present.     Mental Status: She is alert.     Sensory: No sensory deficit.     Motor: No weakness or abnormal muscle tone.      ED Treatments / Results  Labs (all labs ordered are listed, but only abnormal results are displayed) Labs Reviewed  INFLUENZA PANEL BY PCR (TYPE A & B) - Abnormal; Notable for the following components:      Result Value   Influenza A By PCR POSITIVE (*)    All other components within normal limits    EKG None  Radiology Dg Chest 2 View  Result Date: 11/23/2018 CLINICAL DATA:  Pt states having productive cough, fever and back pain x 5 days EXAM: CHEST - 2 VIEW COMPARISON:  04/14/2016 FINDINGS: Lungs are clear. Heart size and mediastinal contours are within normal limits. No effusion. Visualized bones unremarkable. IMPRESSION: No acute cardiopulmonary disease.  Electronically Signed   By: Corlis Leak M.D.   On: 11/23/2018 11:39    Procedures Procedures (including critical care time)  Medications Ordered in ED Medications  ipratropium-albuterol (DUONEB) 0.5-2.5 (3) MG/3ML nebulizer solution 3 mL (3 mLs Nebulization Given 11/23/18 1258)     Initial Impression / Assessment and Plan / ED Course  I have reviewed the triage vital signs and the nursing notes.  Pertinent labs & imaging results that were available during my care of the patient were reviewed by me and considered in my medical decision making (see chart for details).        Patient is well-appearing.  Nontoxic.  Afebrile here.  Chest x-ray reassuring but influenza testing is positive.  Patient is out of window for Tamiflu.  She agrees to conservative therapy with Tylenol and/or Motrin and antitussive medication.  She appears appropriate for discharge home.  Return precautions discussed.  Final Clinical Impressions(s) / ED Diagnoses   Final diagnoses:  Influenza    ED Discharge Orders         Ordered    promethazine-dextromethorphan (PROMETHAZINE-DM) 6.25-15 MG/5ML syrup  4 times daily PRN,   Status:  Discontinued     11/23/18 1250    chlorpheniramine-HYDROcodone (TUSSIONEX PENNKINETIC ER) 10-8 MG/5ML SUER  2 times daily     11/23/18 1432           Pauline Aus, PA-C 11/24/18 2141    Benjiman Core, MD 11/26/18 [phone]

## 2018-12-10 LAB — CBC
HEMATOCRIT: 34.3 % — AB (ref 35.0–45.0)
HEMOGLOBIN: 10.4 g/dL — AB (ref 11.7–15.5)
MCH: 23.4 pg — ABNORMAL LOW (ref 27.0–33.0)
MCHC: 30.3 g/dL — ABNORMAL LOW (ref 32.0–36.0)
MCV: 77.3 fL — ABNORMAL LOW (ref 80.0–100.0)
MPV: 10.5 fL (ref 7.5–12.5)
Platelets: 385 10*3/uL (ref 140–400)
RBC: 4.44 10*6/uL (ref 3.80–5.10)
RDW: 16.4 % — AB (ref 11.0–15.0)
WBC: 11.3 10*3/uL — ABNORMAL HIGH (ref 3.8–10.8)

## 2019-06-09 IMAGING — DX DG CHEST 2V
2 series · 2 of 2 positions shown · non-contrast
Comparison: 04/14/2016

CLINICAL DATA: Pt states having productive cough, fever and back
pain x 5 days

EXAM:
CHEST - 2 VIEW

[chest pa]
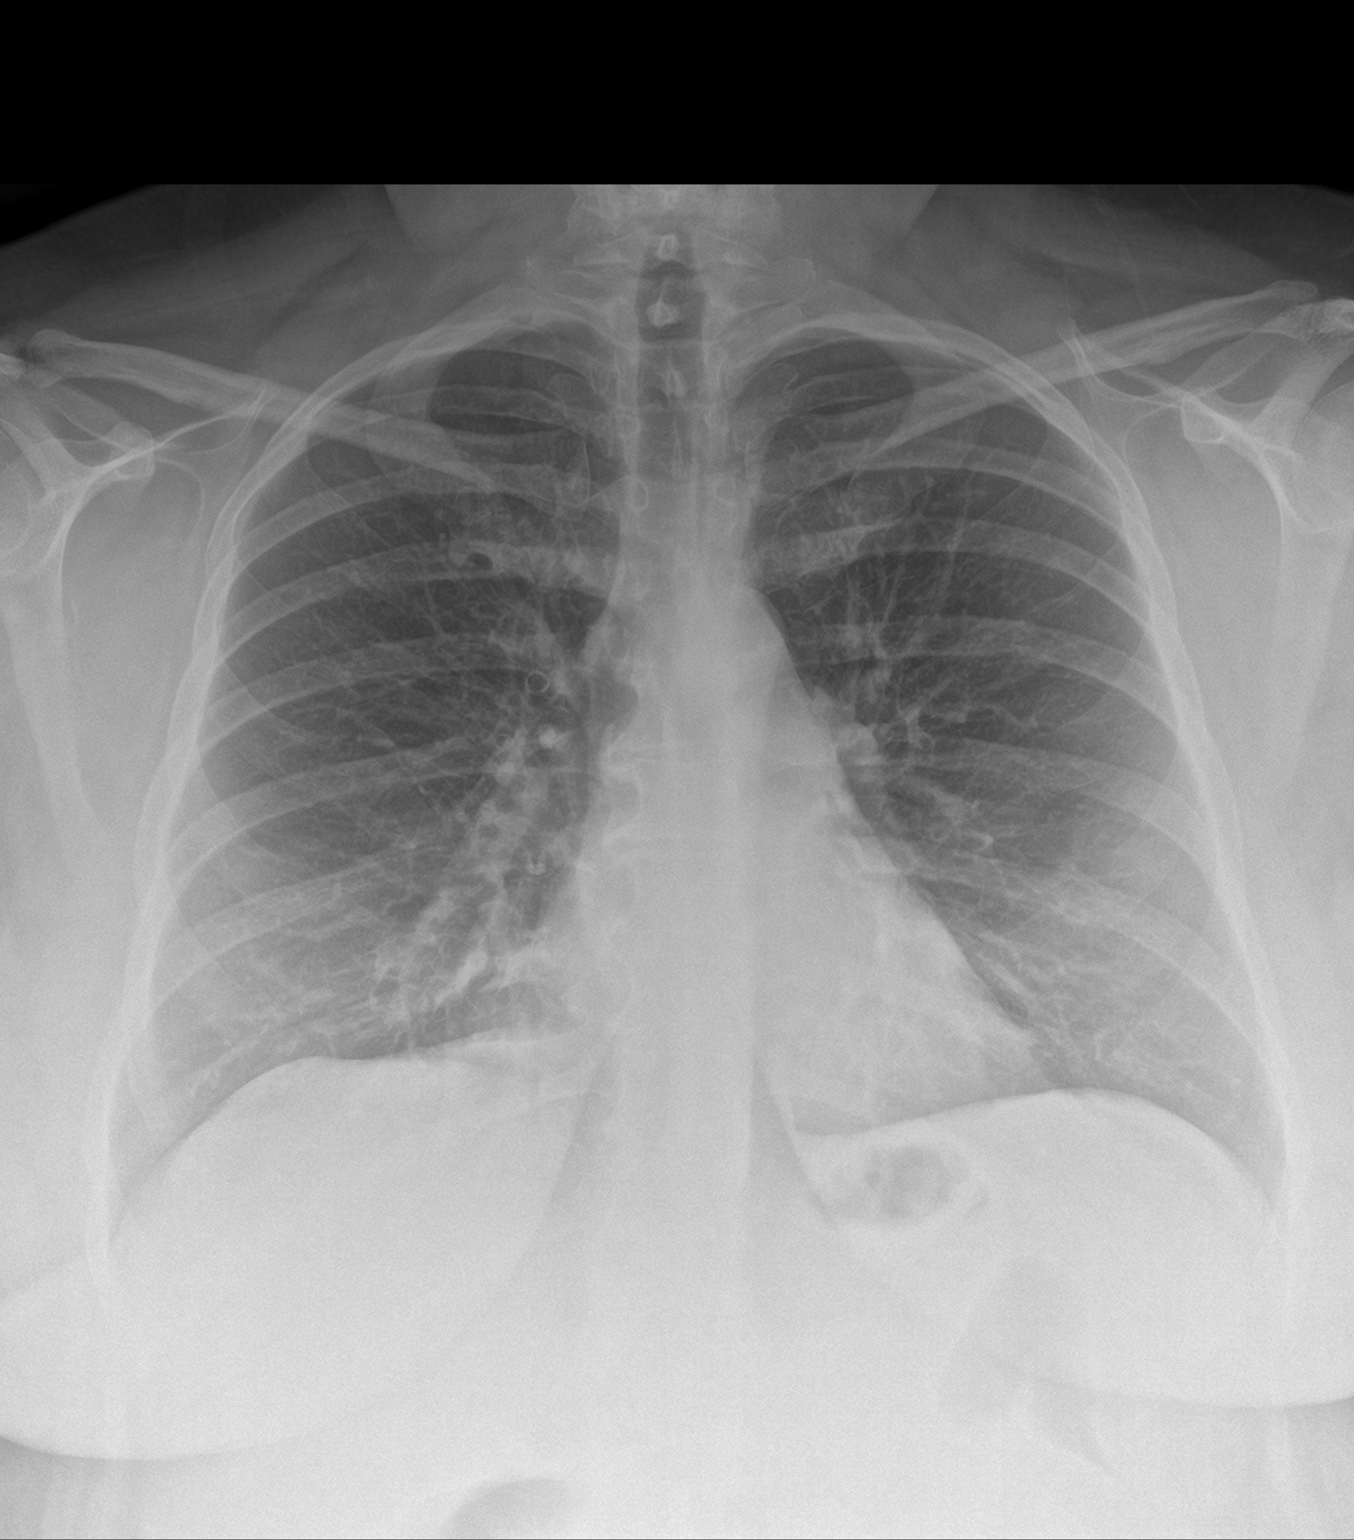

[chest lat]
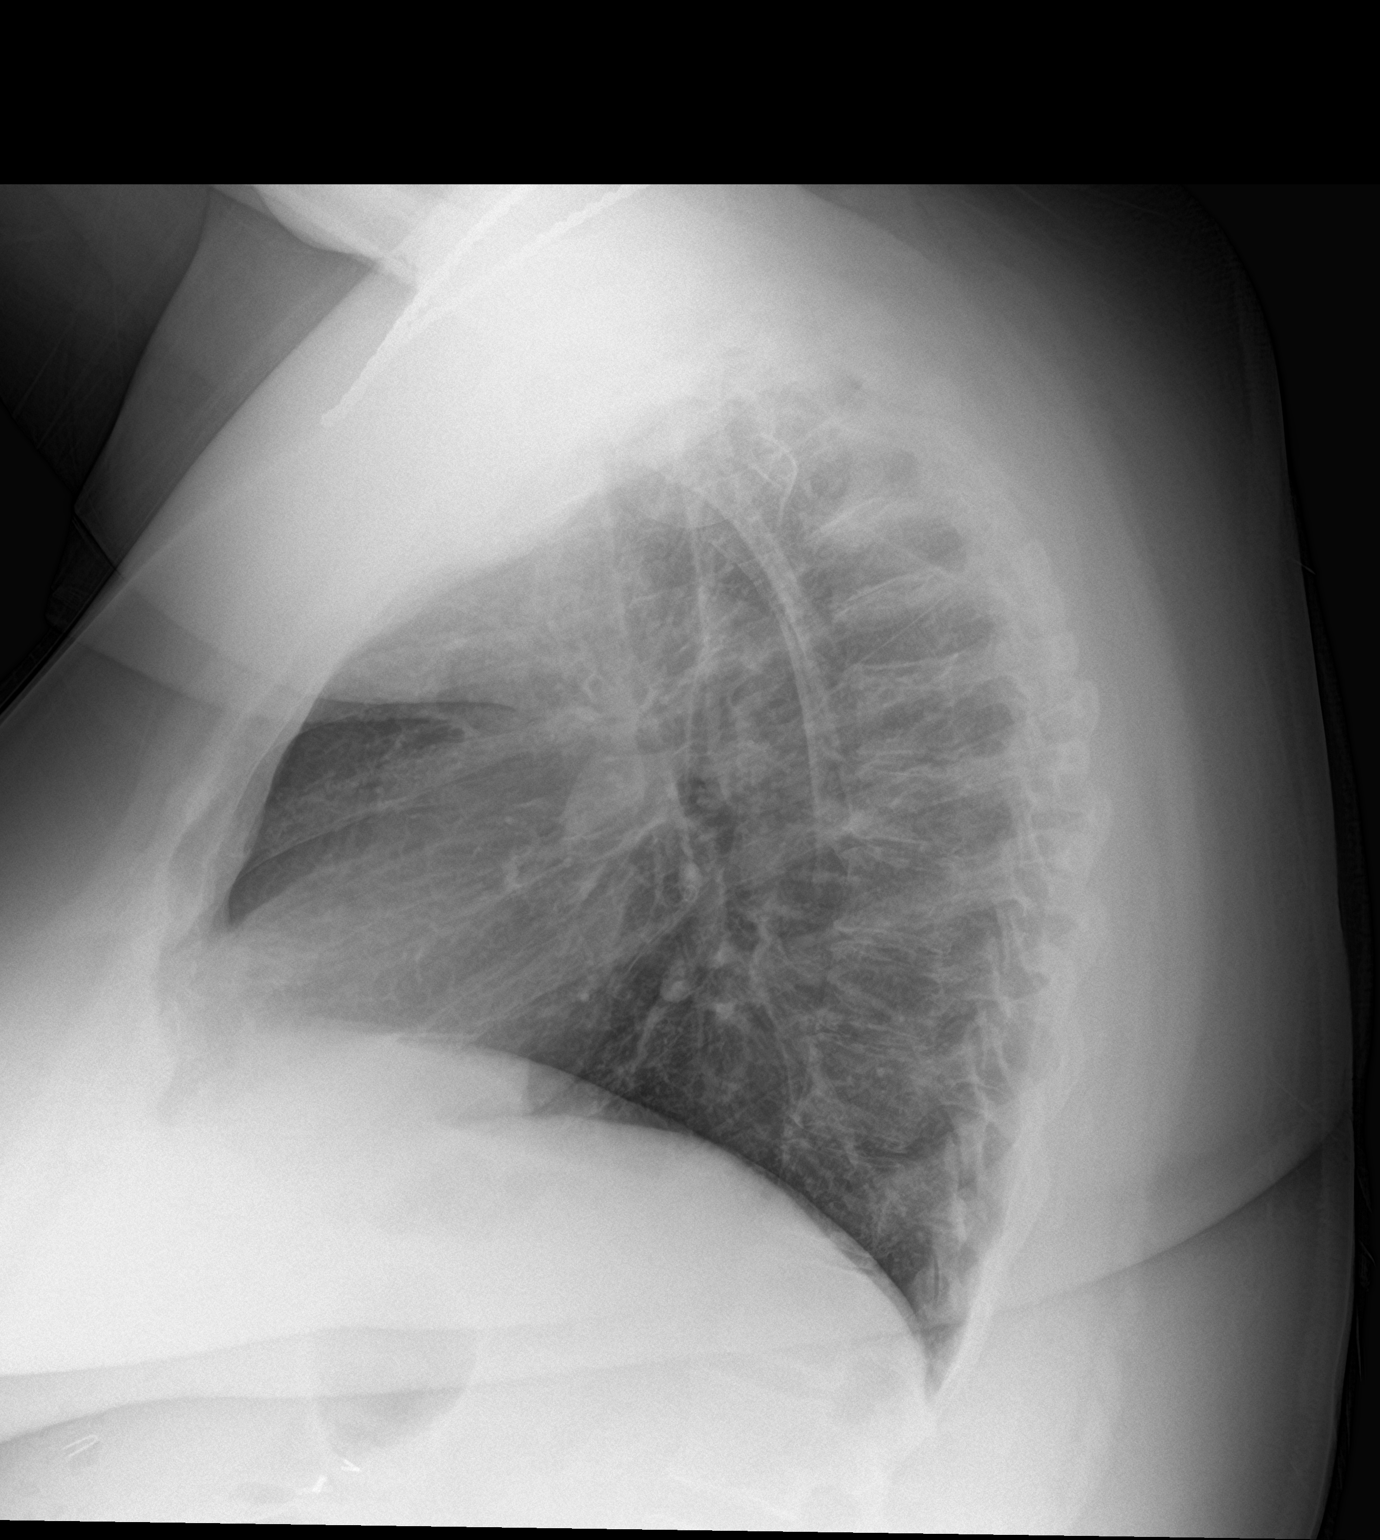

[2 of 2 positions shown; findings below may reference images not displayed]

FINDINGS: Lungs are clear.

Heart size and mediastinal contours are within normal limits.

No effusion.

Visualized bones unremarkable.
IMPRESSION: No acute cardiopulmonary disease.
